# Patient Record
Sex: Female | Born: 1970
Health system: Southern US, Community
[De-identification: ages and names within clinical notes are randomized; demographics above are authoritative.]

## PROBLEM LIST (undated history)

## (undated) DIAGNOSIS — F329 Major depressive disorder, single episode, unspecified: Secondary | ICD-10-CM

## (undated) DIAGNOSIS — L409 Psoriasis, unspecified: Secondary | ICD-10-CM

## (undated) DIAGNOSIS — F419 Anxiety disorder, unspecified: Secondary | ICD-10-CM

## (undated) DIAGNOSIS — F32A Depression, unspecified: Secondary | ICD-10-CM

## (undated) HISTORY — DX: Anxiety disorder, unspecified: F41.9

---

## 2005-03-13 ENCOUNTER — Encounter: Admission: RE | Admit: 2005-03-13 | Discharge: 2005-03-13 | Payer: Self-pay | Admitting: *Deleted

## 2007-03-22 ENCOUNTER — Encounter: Admission: RE | Admit: 2007-03-22 | Discharge: 2007-03-22 | Payer: Self-pay | Admitting: *Deleted

## 2007-03-22 IMAGING — MG MM DIAGNOSTIC BILATERAL
5 series · 5 of 5 positions shown · non-contrast
Comparison: none

DG DIAGNOSTIC BILATERAL
Bilateral CC and MLO view(s) were taken.

RIGHT BREAST ULTRASOUND
Technologist: CARMEN SOCORRO, Medical
DIGITAL BILATERAL DIAGNOSTIC MAMMOGRAM WITH CAD AND RIGHT BREAST ULTRASOUND:
CLINICAL DATA: The patient has a tender lump in the right subareolar region at 10 o'clock.  She 
states that this has decreased in size since it was initially found.

[R CC]
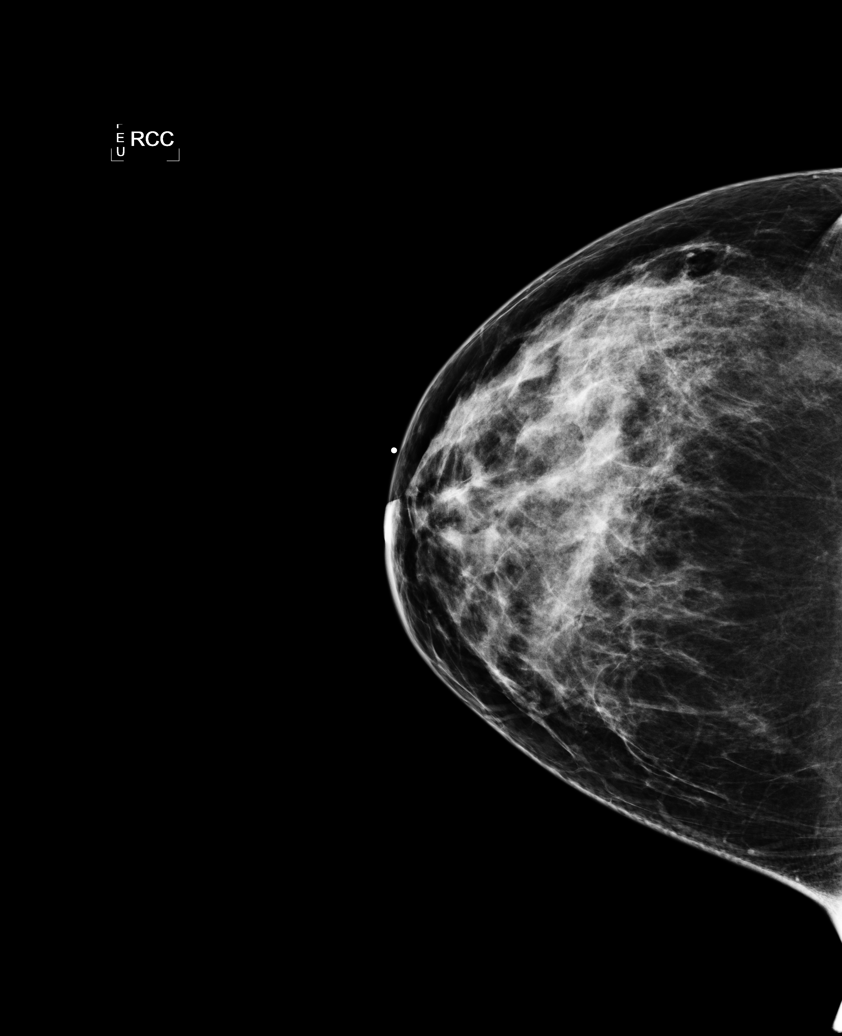

[L CC]
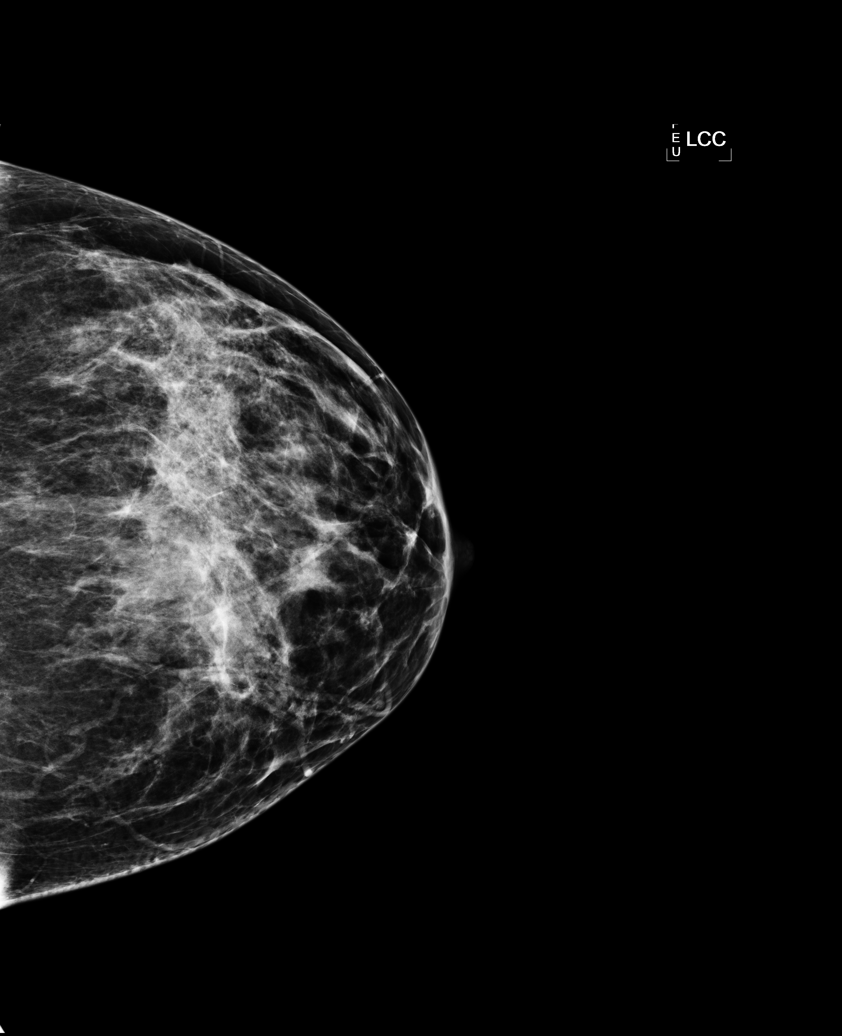

[L MLO]
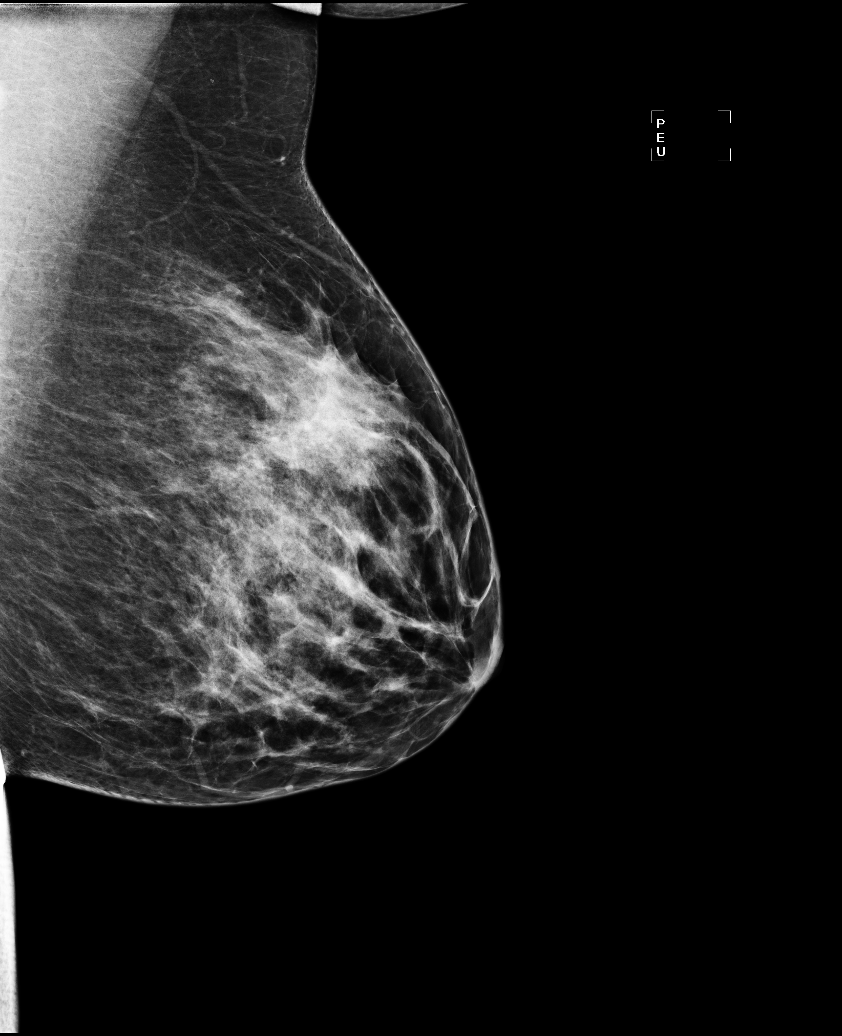

[R MLO]
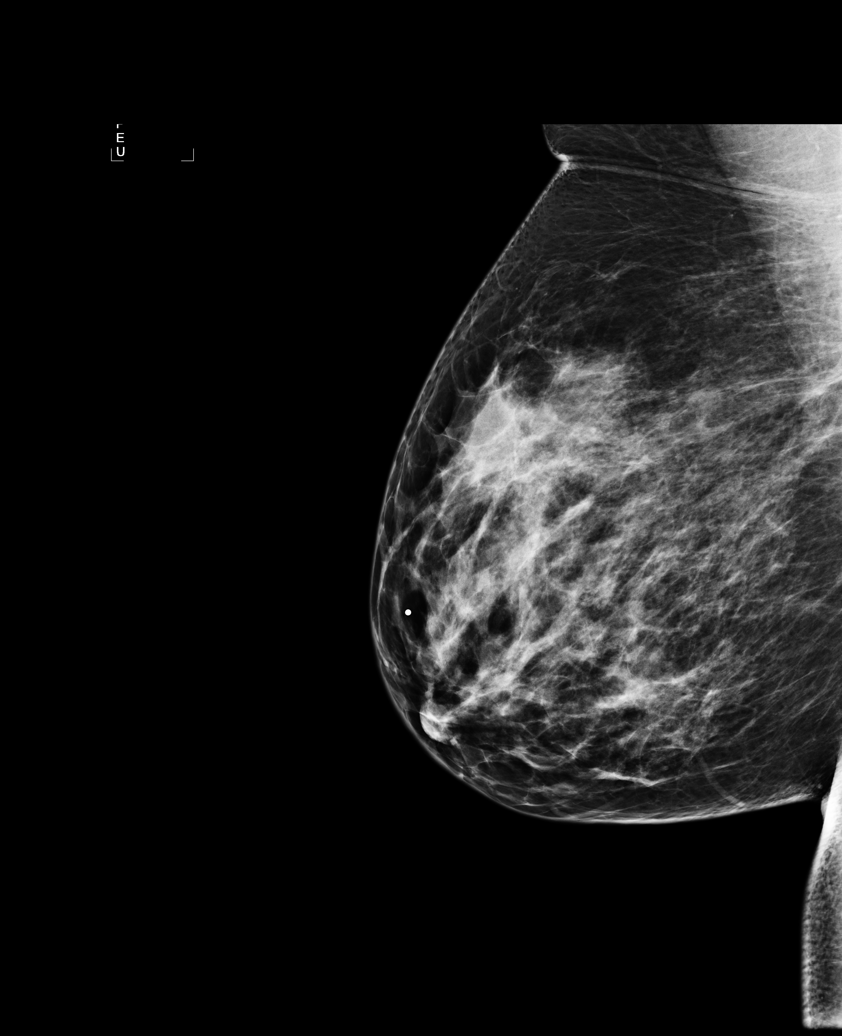

[R TAN]
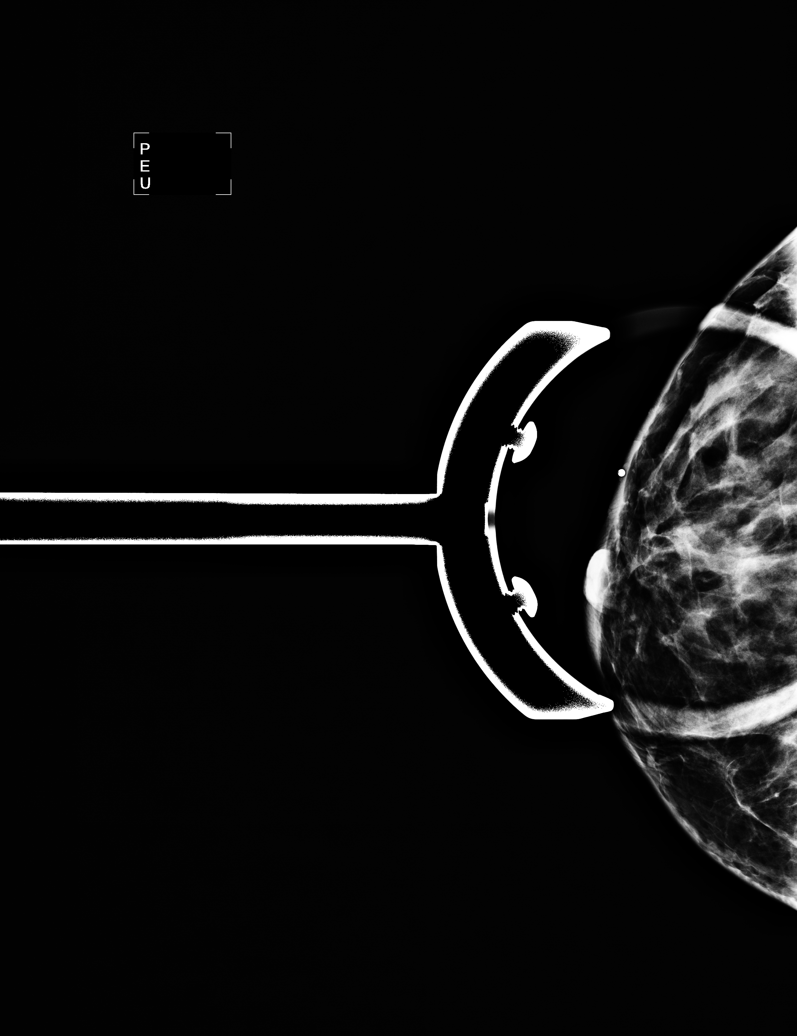

[5 of 5 positions shown; findings below may reference images not displayed]

Comparison is made to prior study dated [DATE].  The breast tissue is heterogeneously dense.  There
is no dominant mass, architectural distortion or calcification to suggest malignancy.

On physical examination, no mass is palpated in the right subareolar region.  Sonography of the 
area of concern to the patient at 10 o'clock in the right subareolar region demonstrates mixed 
fibroglandular tissue and fat with no mass, distortion or shadowing to suggest malignancy.  No 
fluid collection to suggest abscess is identified. There is no erythema or warmth on physical 
examination.
IMPRESSION: No mammographic or sonographic evidence of malignancy.  Yearly screening mammography should begin 
at age 40 unless clinically indicated earlier.

ASSESSMENT: Negative - BI-RADS 1

Routine screening mammogram at age 41.
ANALYZED BY COMPUTER AIDED DETECTION. ,

## 2007-03-22 IMAGING — US UNKNOWN PR STUDY
1 series · 2 of 2 positions shown · non-contrast
Comparison: none

DG DIAGNOSTIC BILATERAL
Bilateral CC and MLO view(s) were taken.

RIGHT BREAST ULTRASOUND
Technologist: CARMEN SOCORRO, Medical
DIGITAL BILATERAL DIAGNOSTIC MAMMOGRAM WITH CAD AND RIGHT BREAST ULTRASOUND:
CLINICAL DATA: The patient has a tender lump in the right subareolar region at 10 o'clock.  She 
states that this has decreased in size since it was initially found.

[Series 1: unknown pr study · 2 of 2 slices shown]
[im 1/2]
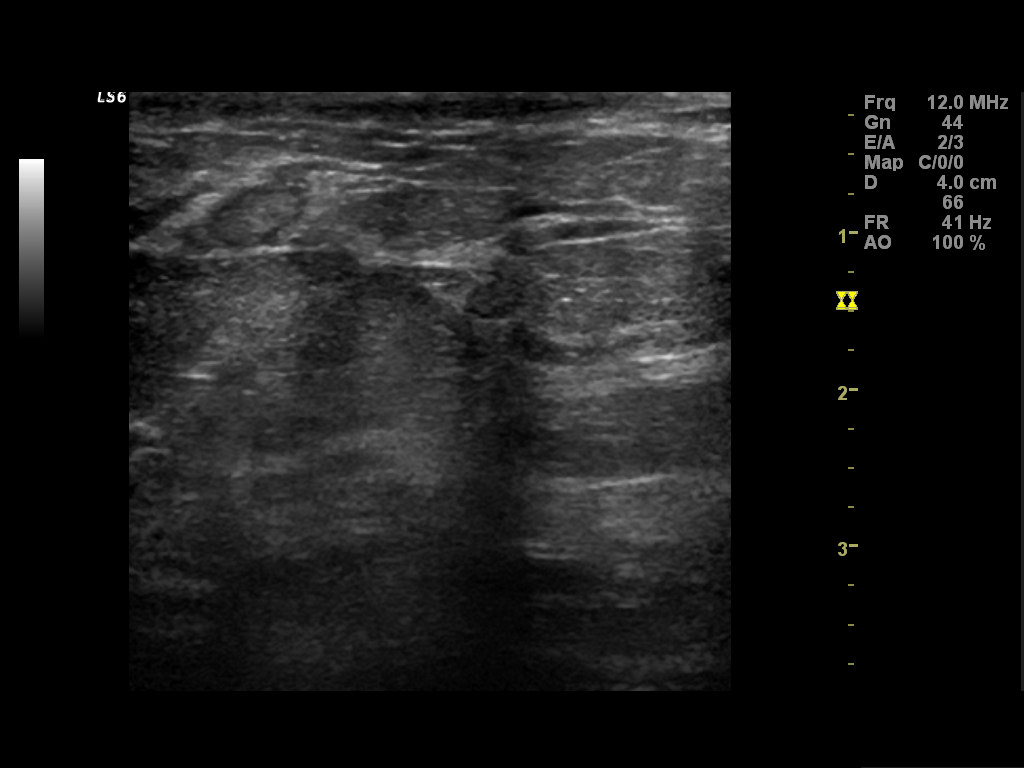
[im 2/2]
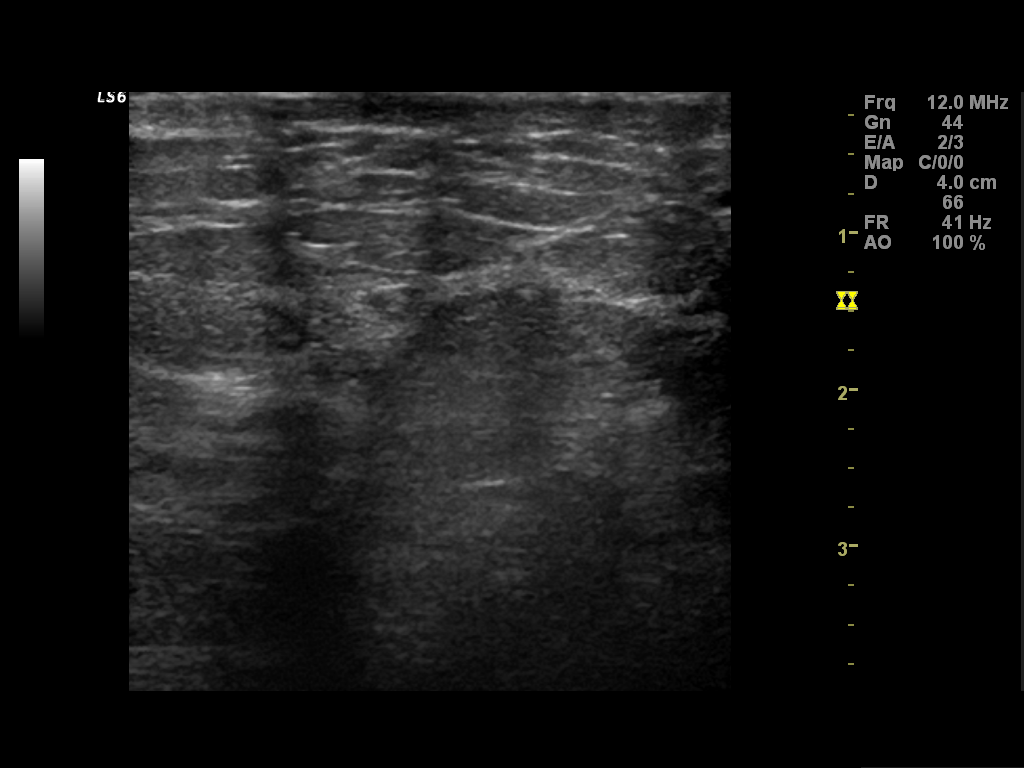

[2 of 2 positions shown; findings below may reference images not displayed]

Comparison is made to prior study dated [DATE].  The breast tissue is heterogeneously dense.  There
is no dominant mass, architectural distortion or calcification to suggest malignancy.

On physical examination, no mass is palpated in the right subareolar region.  Sonography of the 
area of concern to the patient at 10 o'clock in the right subareolar region demonstrates mixed 
fibroglandular tissue and fat with no mass, distortion or shadowing to suggest malignancy.  No 
fluid collection to suggest abscess is identified. There is no erythema or warmth on physical 
examination.
IMPRESSION: No mammographic or sonographic evidence of malignancy.  Yearly screening mammography should begin 
at age 41 unless clinically indicated earlier.

ASSESSMENT: Negative - BI-RADS 1

Routine screening mammogram at age 41.
ANALYZED BY COMPUTER AIDED DETECTION. ,

## 2010-03-10 ENCOUNTER — Emergency Department (HOSPITAL_COMMUNITY): Admission: EM | Admit: 2010-03-10 | Discharge: 2010-03-10 | Payer: Self-pay | Admitting: Emergency Medicine

## 2010-03-10 IMAGING — CT CT HEAD W/O CM
1 series · 16 of 30 positions shown, 20 images · non-contrast
Comparison: None.

CLINICAL DATA: Headache, dizziness

CT HEAD WITHOUT CONTRAST
TECHNIQUE: Contiguous axial images were obtained from the base of
the skull through the vertex without contrast.

[Series 2: head_seq 4.5 h37s st · axial · 0.41mm/px · z∈[+1264,+1408]mm · 16 of 36 slices shown, 20 images]
[im 2/36  brain]
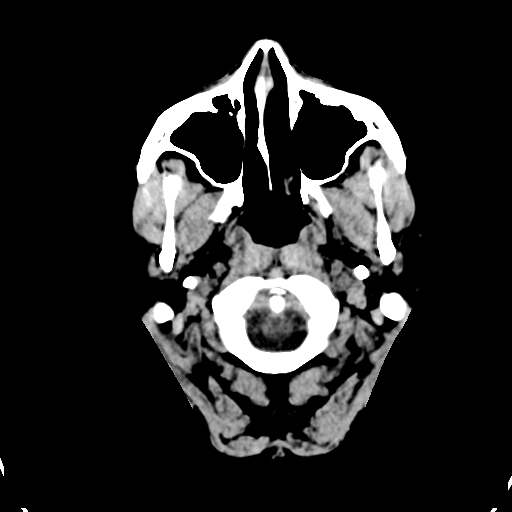
[im 2/36  bone]
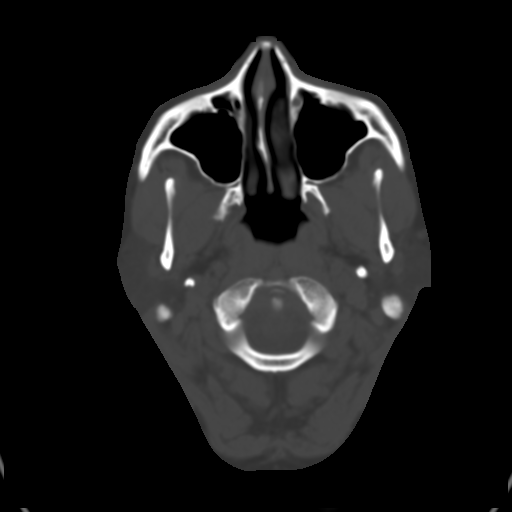
[im 4/36  brain]
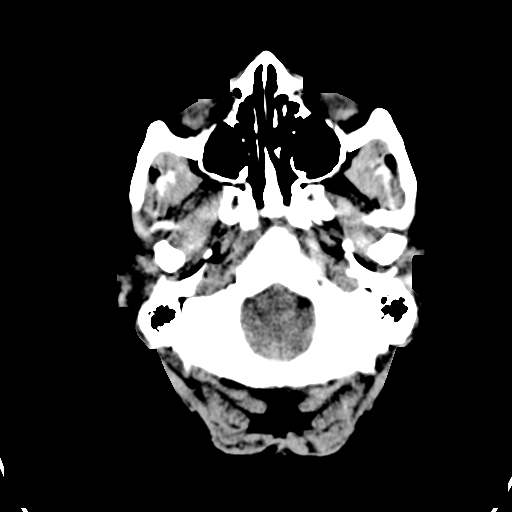
[im 7/36  brain]
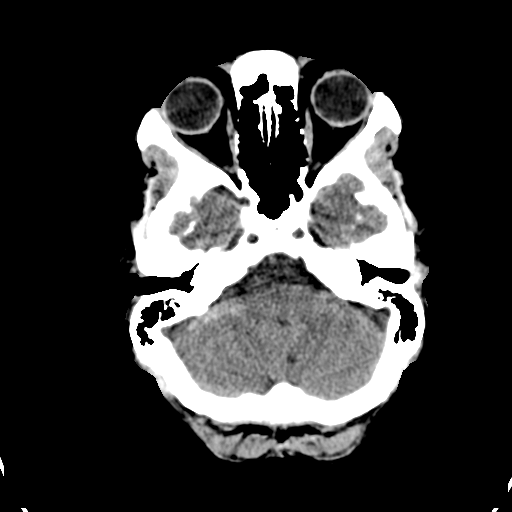
[im 9/36  brain]
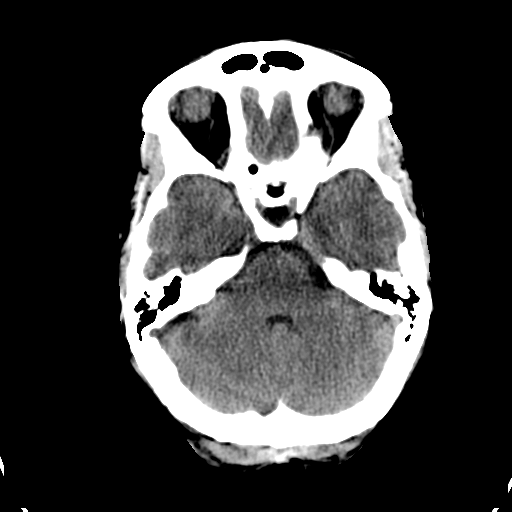
[im 10/36  brain]
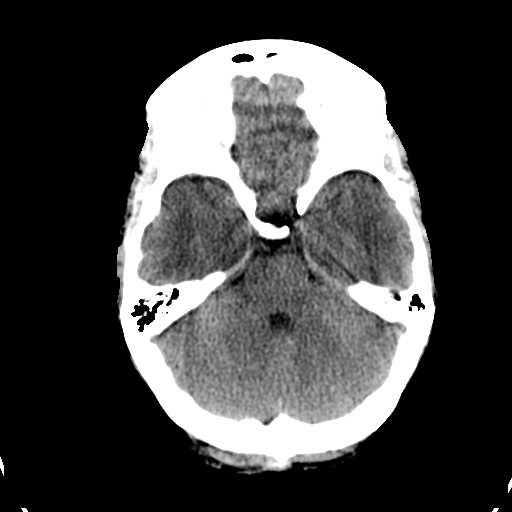
[im 10/36  bone]
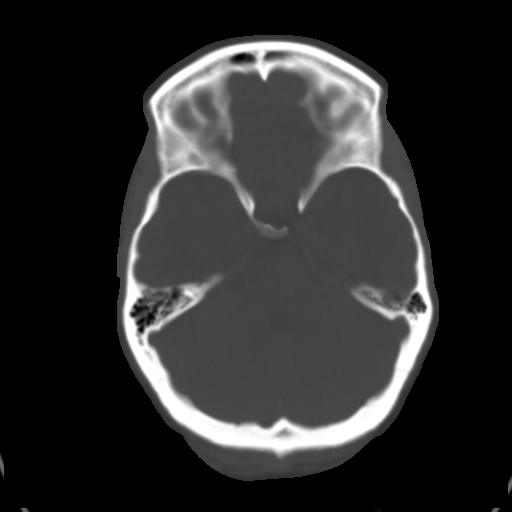
[im 13/36  brain]
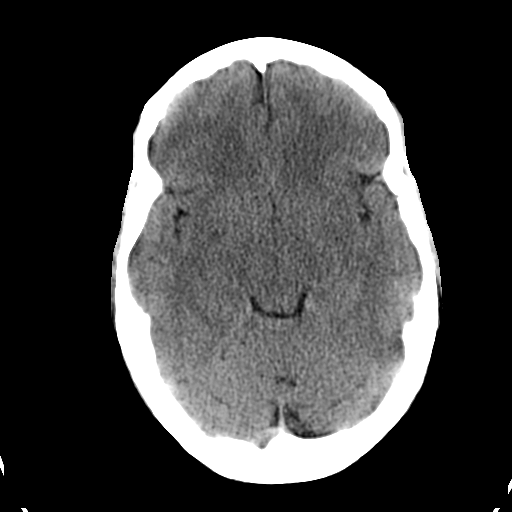
[im 15/36  brain]
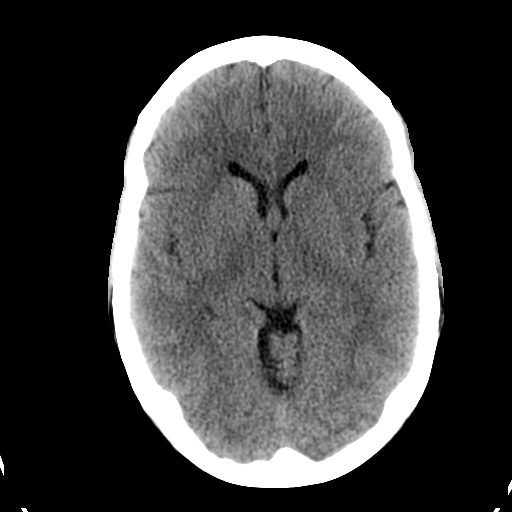
[im 17/36  brain]
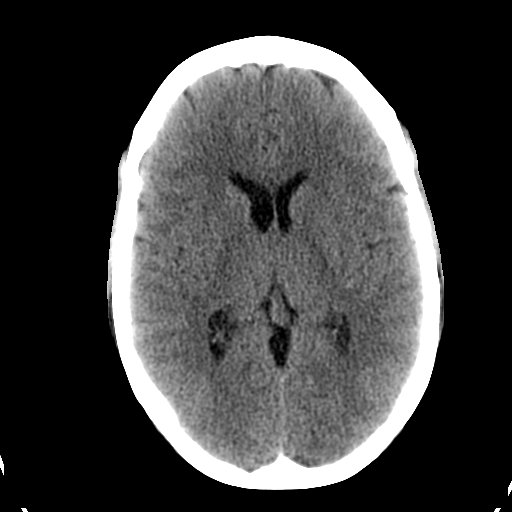
[im 19/36  brain]
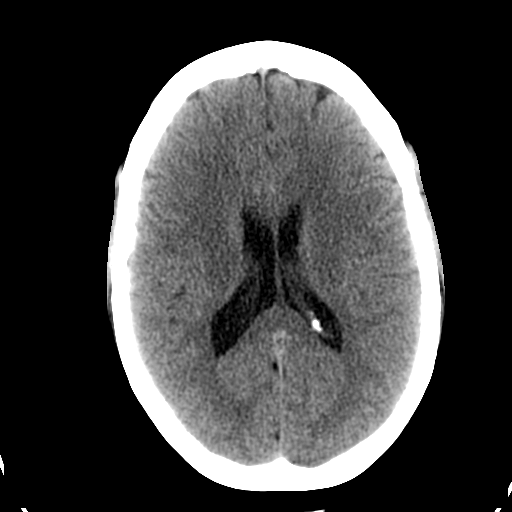
[im 19/36  bone]
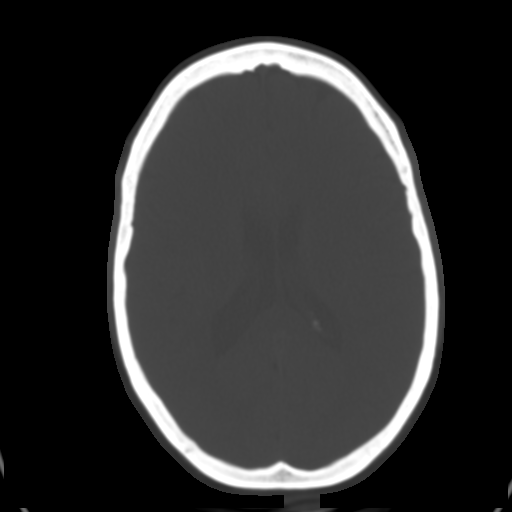
[im 21/36  brain]
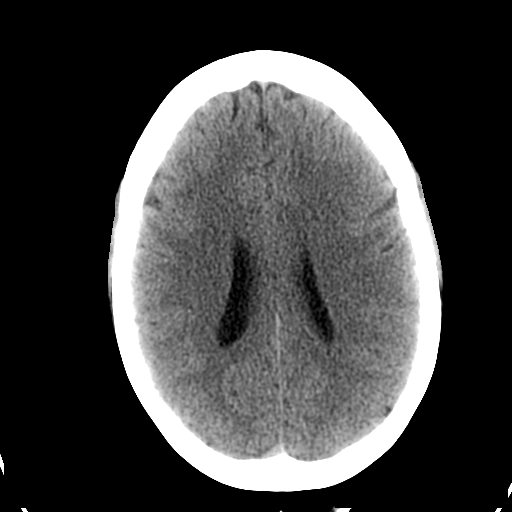
[im 23/36  brain]
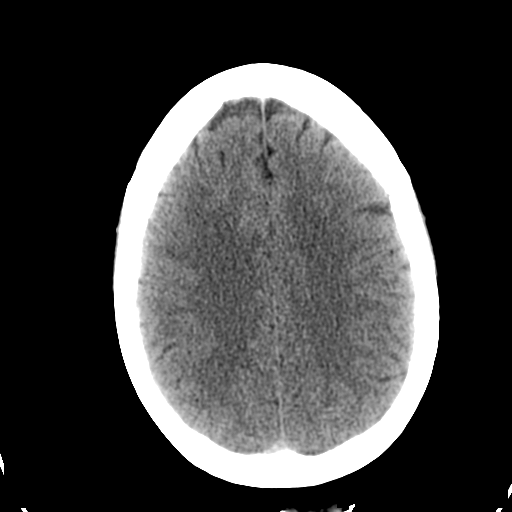
[im 26/36  brain]
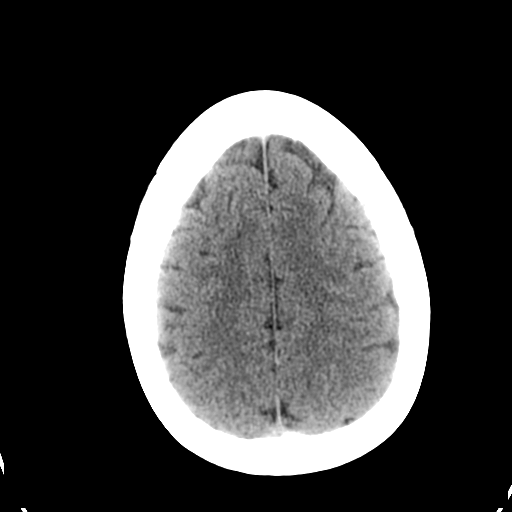
[im 27/36  brain]
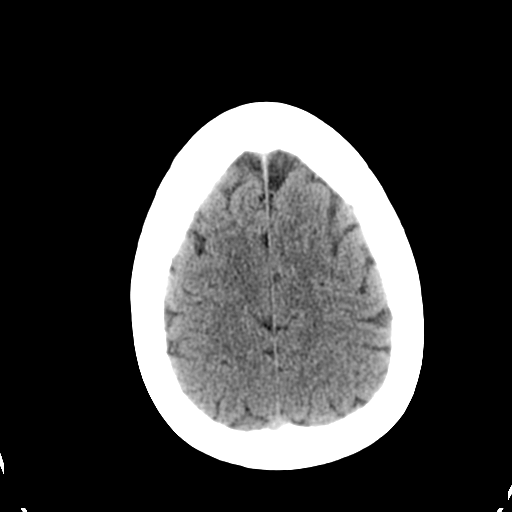
[im 27/36  bone]
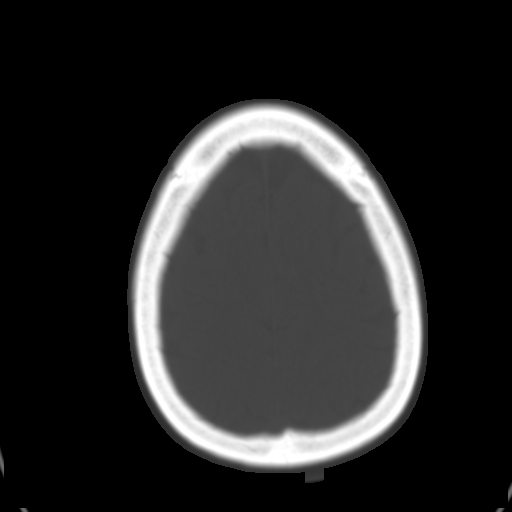
[im 29/36  brain]
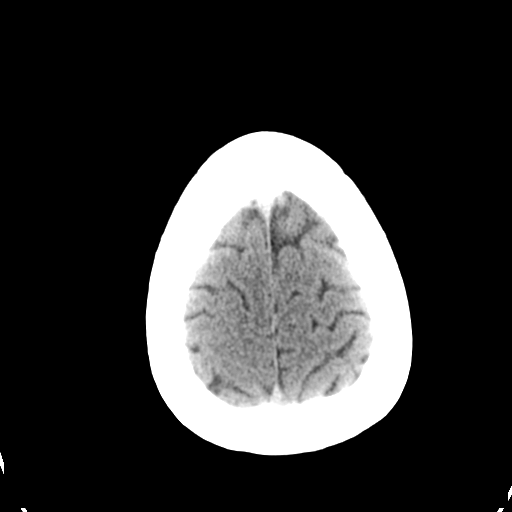
[im 32/36  brain]
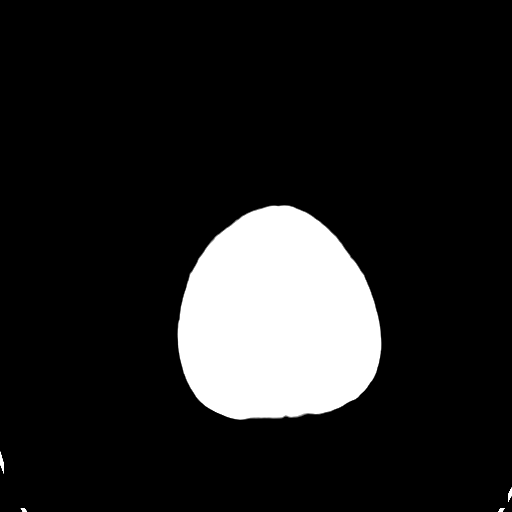
[im 34/36  brain]
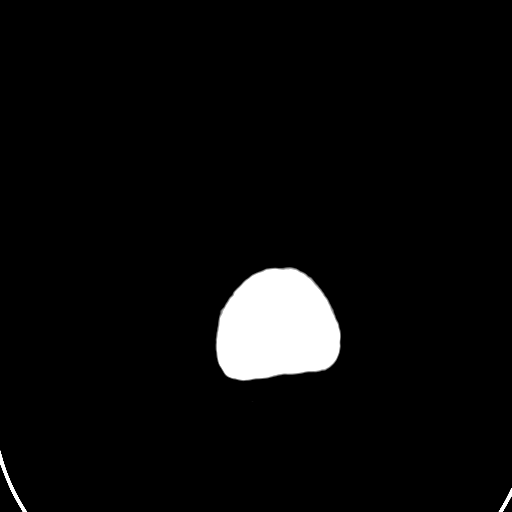

[16 of 30 positions shown; findings below may reference images not displayed]

FINDINGS: There is no evidence of acute intracranial hemorrhage,
brain edema, mass lesion, acute infarction,   mass effect, or
midline shift. Acute infarct may be inapparent on noncontrast CT.
No other intra-axial abnormalities are seen, and the ventricles and
sulci are within normal limits in size and symmetry.   No abnormal
extra-axial fluid collections or masses are identified.  No
significant calvarial abnormality.
IMPRESSION: Negative for bleed or other acute intracranial process.

## 2010-09-26 LAB — CSF CELL COUNT WITH DIFFERENTIAL
RBC Count, CSF: 1850 /mm3 — ABNORMAL HIGH
Tube #: 1
WBC, CSF: 1 /mm3 (ref 0–5)

## 2010-09-26 LAB — CSF CULTURE W GRAM STAIN

## 2011-08-28 ENCOUNTER — Emergency Department (HOSPITAL_COMMUNITY)
Admission: EM | Admit: 2011-08-28 | Discharge: 2011-08-29 | Disposition: A | Discharge status: Home / Self Care | Payer: Self-pay | Attending: Emergency Medicine | Admitting: Emergency Medicine

## 2011-08-28 ENCOUNTER — Encounter (HOSPITAL_COMMUNITY): Payer: Self-pay | Admitting: *Deleted

## 2011-08-28 DIAGNOSIS — W64XXXA Exposure to other animate mechanical forces, initial encounter: Secondary | ICD-10-CM | POA: Insufficient documentation

## 2011-08-28 DIAGNOSIS — IMO0002 Reserved for concepts with insufficient information to code with codable children: Secondary | ICD-10-CM | POA: Insufficient documentation

## 2011-08-28 DIAGNOSIS — Y9239 Other specified sports and athletic area as the place of occurrence of the external cause: Secondary | ICD-10-CM | POA: Insufficient documentation

## 2011-08-28 DIAGNOSIS — Y92838 Other recreation area as the place of occurrence of the external cause: Secondary | ICD-10-CM | POA: Insufficient documentation

## 2011-08-28 DIAGNOSIS — Z203 Contact with and (suspected) exposure to rabies: Secondary | ICD-10-CM | POA: Insufficient documentation

## 2011-08-28 DIAGNOSIS — Z23 Encounter for immunization: Secondary | ICD-10-CM | POA: Insufficient documentation

## 2011-08-28 MED ORDER — RABIES VACCINE, PCEC IM SUSR
1.0000 mL | Freq: Once | INTRAMUSCULAR | Status: DC
  Filled 2011-08-28: qty 1

## 2011-08-28 MED ORDER — RABIES IMMUNE GLOBULIN 150 UNIT/ML IM INJ
20.0000 [IU]/kg | INJECTION | Freq: Once | INTRAMUSCULAR | Status: DC
  Filled 2011-08-28: qty 10
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
## 2011-08-29 NOTE — ED Provider Notes (Signed)
Medical screening examination/treatment/procedure(s) were performed by non-physician practitioner and as supervising physician I was immediately available for consultation/collaboration.  Doug Sou, MD 08/29/11 [phone]

## 2011-08-29 NOTE — Discharge Instructions (Signed)
Rabies  You may have been exposed to rabies. It can be carried by skunks, bats, woodchucks, raccoons and foxes. It is less common in dogs; however this is one of the most common bites for which the rabies vaccine is given. When bitten by an infected animal, a person gets rabies from the infected saliva (spit) of the animal. Most people get sick 20 to 90 days after being bitten. This varies based on the location of the bite. The rabies virus affects the brain so the closer to the head the bite occurs, the less time it will take the illness to develop. Once rabies develops it almost always causes death. Because of this, it is often necessary to start treatment whether it is known if the animal is healthy or not. If bitten by an animal and the animal can be observed to see if it remains healthy, often no further treatment is necessary other than care of the wounds caused by the animal. If the animal has been killed it can be sent into a state laboratory and the brain can be examined to see if the animal had rabies. TREATMENT  There is no known treatment for rabies once the disease starts. This is a viral illness and antibiotics (medications which kill germs) do not help. This is why caregivers use extra caution and treat questionable bites with rabies vaccine to prevent the disease. RABIES IMMUNIZATION SCHEDULE - POST-EXPOSURE Be sure to record the dates of your injections for your records. 1st Injection Date________________________ Day 3__________________________________ Day 7__________________________________ Day 14_________________________________ HOME CARE INSTRUCTIONS  If you have received a bite from an unknown animal, make sure you know the instructions for follow up. If the animal was sent to a laboratory for examination, make sure you know how you are to obtain your results.  Keep wound clean, dry and dressed as suggested by your caregiver.   Take antibiotics as directed and finish the  prescription, even if the wound appears OK.   Keep injured part elevated as much as possible.   Do not resume use of the affected area until directed.   Only take over-the-counter or prescription medicines for pain, discomfort, or fever as directed by your caregiver.  SEEK IMMEDIATE MEDICAL CARE IF:   You have a fever.   There is nausea (feeling sick to your stomach), vomiting, chills or a high temperature.   There is unusual behavior including hyperactivity, fear of water (hydrophobia), or fear of air (aerophobia).   If pain prevents movement of any joint.   If you are unable to move a finger or toe.   The wound becomes more inflamed and swollen, or has a purulent (pus-like) discharge.   You notice that there is a foreign substance, such as cloth or a tooth, in the wound.   If a red line develops at the site of the wound and begins to move up the arm or leg.  Document Released: 06/29/2005 Document Revised: 03/11/2011 Document Reviewed: 10/04/2008 [facility] Patient Information 2012 [place], [place].

## 2011-09-01 ENCOUNTER — Emergency Department (INDEPENDENT_AMBULATORY_CARE_PROVIDER_SITE_OTHER)
Admission: EM | Admit: 2011-09-01 | Discharge: 2011-09-01 | Disposition: A | Discharge status: Home / Self Care | Payer: Self-pay | Source: Home / Self Care | Attending: Family Medicine | Admitting: Family Medicine

## 2011-09-01 ENCOUNTER — Encounter (HOSPITAL_COMMUNITY): Payer: Self-pay | Admitting: *Deleted

## 2011-09-01 DIAGNOSIS — Z23 Encounter for immunization: Secondary | ICD-10-CM

## 2011-09-01 MED ORDER — RABIES VACCINE, PCEC IM SUSR
INTRAMUSCULAR | Status: AC
  Filled 2011-09-01: qty 1

## 2011-09-01 MED ORDER — RABIES VACCINE, PCEC IM SUSR
1.0000 mL | Freq: Once | INTRAMUSCULAR | Status: AC
  Administered 2011-09-01: 1 mL via INTRAMUSCULAR

## 2011-09-01 NOTE — ED Notes (Signed)
Here for second Rabies vaccine for fox scratch to R shin with tooth. Wound healing. No signs of infection.

## 2011-09-01 NOTE — Discharge Instructions (Signed)
Call if any problems.  Return 2/23 for next vaccine.

## 2011-09-05 ENCOUNTER — Emergency Department (HOSPITAL_COMMUNITY)
Admission: EM | Admit: 2011-09-05 | Discharge: 2011-09-05 | Disposition: A | Discharge status: Home / Self Care | Payer: Self-pay | Source: Home / Self Care | Attending: Emergency Medicine | Admitting: Emergency Medicine

## 2011-09-05 ENCOUNTER — Encounter (HOSPITAL_COMMUNITY): Payer: Self-pay | Admitting: *Deleted

## 2011-09-05 MED ORDER — RABIES VACCINE, PCEC IM SUSR
INTRAMUSCULAR | Status: AC
  Filled 2011-09-05: qty 1

## 2011-09-05 MED ORDER — RABIES VACCINE, PCEC IM SUSR
1.0000 mL | Freq: Once | INTRAMUSCULAR | Status: AC
  Administered 2011-09-05: 1 mL via INTRAMUSCULAR

## 2011-09-05 NOTE — ED Notes (Signed)
Patient here for 2nd rabies follow up injection

## 2011-09-05 NOTE — ED Notes (Signed)
Pt here for rabies vaccine - no c/o - scratch site with no signs of infection

## 2011-09-05 NOTE — Discharge Instructions (Signed)
Please return next Saturday for final injection.

## 2011-09-12 ENCOUNTER — Emergency Department (HOSPITAL_COMMUNITY)
Admission: EM | Admit: 2011-09-12 | Discharge: 2011-09-12 | Disposition: A | Discharge status: Home / Self Care | Payer: Self-pay | Source: Home / Self Care | Attending: Family Medicine | Admitting: Family Medicine

## 2011-09-12 ENCOUNTER — Encounter (HOSPITAL_COMMUNITY): Payer: Self-pay | Admitting: Cardiology

## 2011-09-12 MED ORDER — RABIES VACCINE, PCEC IM SUSR
INTRAMUSCULAR | Status: AC
  Filled 2011-09-12: qty 1

## 2011-09-12 MED ORDER — IBUPROFEN 800 MG PO TABS
ORAL_TABLET | ORAL | Status: AC
  Filled 2011-09-12: qty 1

## 2011-09-12 MED ORDER — RABIES VACCINE, PCEC IM SUSR
1.0000 mL | Freq: Once | INTRAMUSCULAR | Status: AC
  Administered 2011-09-12: 1 mL via INTRAMUSCULAR

## 2011-09-12 NOTE — ED Notes (Signed)
Pt here for final rabies vaccine. No sign of infection at scratch site. Pt denies fever.

## 2011-09-12 NOTE — Discharge Instructions (Signed)
No more injections!!!  Rabies series is complete

## 2011-09-30 ENCOUNTER — Telehealth (HOSPITAL_COMMUNITY): Payer: Self-pay | Admitting: *Deleted

## 2011-09-30 NOTE — ED Notes (Signed)
3/13 Pt. called and said she is missing the paperwork for rabies shot on 2/23 and needs a copy for her [facility] insurance.  I asked Mable Fill and she said the charge has not been released yet but she will work on it now so it will be sent tomorrow. 3/14 1559 I called and left message that the charge would be sent to her in a few days.  I asked her to call me when she received the message.

## 2011-09-30 NOTE — ED Notes (Signed)
I called pt. back and she said she had phone problems and did not receive my message. I told her we had completed the charging and the bill should be coming ? this week. Pt. said she has not received it yet but was able to use her d/c instructions for proof of visit that day. She said she is trying to get her records. I told her if she comes here with a picture ID and fills out a medical release form we can give her the records from the 3 visits at [facility]. Vassie Moselle 09/30/2011

## 2012-05-13 ENCOUNTER — Ambulatory Visit
Admission: RE | Admit: 2012-05-13 | Discharge: 2012-05-13 | Disposition: A | Discharge status: Home / Self Care | Payer: No Typology Code available for payment source | Source: Ambulatory Visit | Attending: Family Medicine | Admitting: Family Medicine

## 2012-05-13 ENCOUNTER — Other Ambulatory Visit: Payer: Self-pay | Admitting: Family Medicine

## 2012-05-13 DIAGNOSIS — Z006 Encounter for examination for normal comparison and control in clinical research program: Secondary | ICD-10-CM

## 2012-05-13 DIAGNOSIS — L409 Psoriasis, unspecified: Secondary | ICD-10-CM

## 2012-05-13 IMAGING — CR DG CHEST 1V
1 series · 1 of 1 positions shown · non-contrast
Comparison: None.

CLINICAL DATA: History of psoriasis, [REDACTED] study, smoking
history

CHEST - 1 VIEW

[w chest pa]
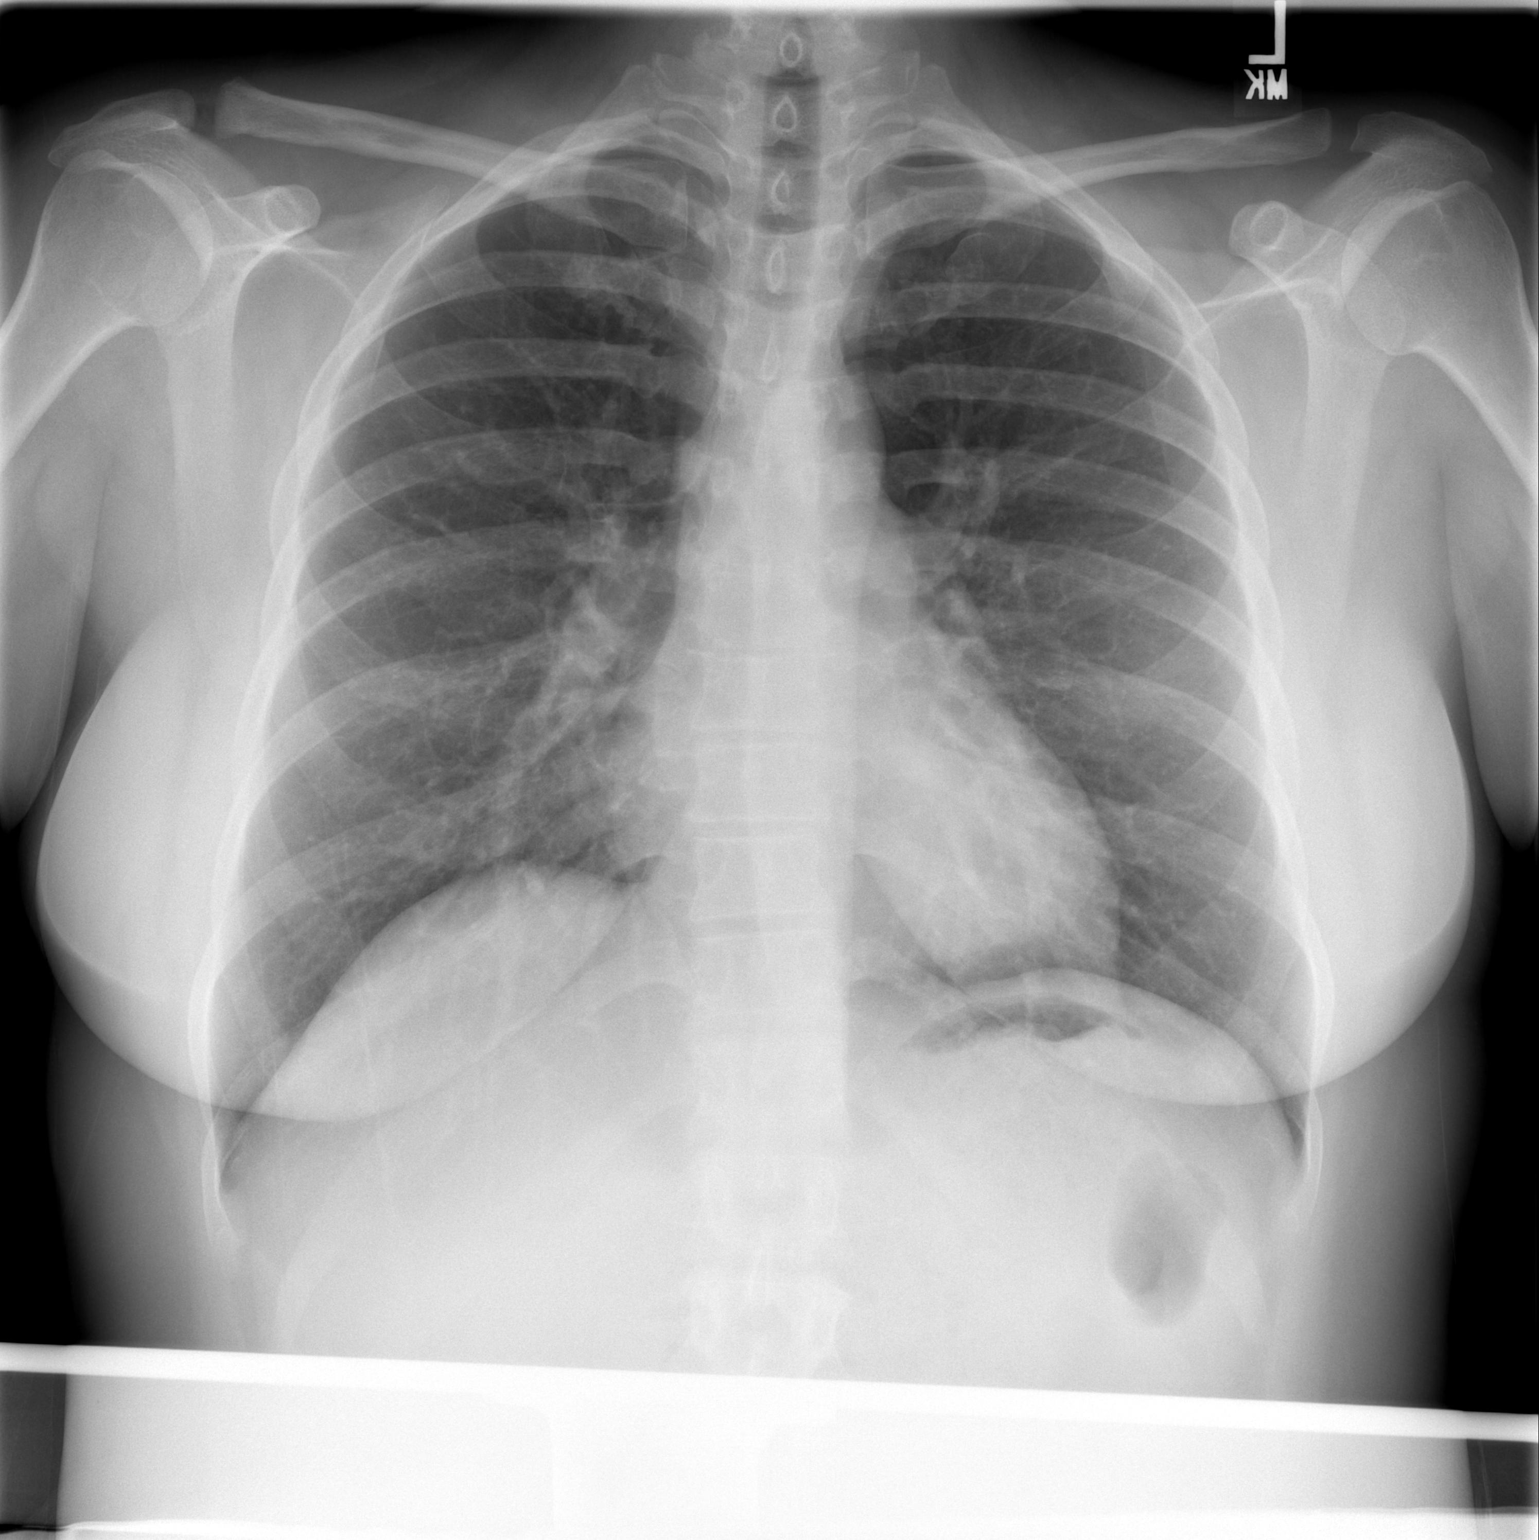

[1 of 1 positions shown; findings below may reference images not displayed]

FINDINGS: No active infiltrate or effusion is seen.  Mediastinal
contours appear normal.  The heart is within normal limits in size.
No bony abnormality is seen.
IMPRESSION: No active lung disease.

## 2012-08-01 ENCOUNTER — Ambulatory Visit: Payer: Managed Care, Other (non HMO)

## 2012-08-01 ENCOUNTER — Ambulatory Visit (INDEPENDENT_AMBULATORY_CARE_PROVIDER_SITE_OTHER): Payer: Managed Care, Other (non HMO) | Admitting: Internal Medicine

## 2012-08-01 VITALS — BP 142/82 | HR 80 | Temp 98.4°F | Resp 12 | Ht 62.0 in | Wt 193.0 lb

## 2012-08-01 DIAGNOSIS — M25579 Pain in unspecified ankle and joints of unspecified foot: Secondary | ICD-10-CM

## 2012-08-01 DIAGNOSIS — Z6835 Body mass index (BMI) 35.0-35.9, adult: Secondary | ICD-10-CM

## 2012-08-01 DIAGNOSIS — L409 Psoriasis, unspecified: Secondary | ICD-10-CM

## 2012-08-01 DIAGNOSIS — L408 Other psoriasis: Secondary | ICD-10-CM

## 2012-08-01 IMAGING — CR DG ANKLE COMPLETE 3+V*L*
3 series · 3 of 3 positions shown · non-contrast
Comparison: None.

CLINICAL DATA: Pain after fall.

LEFT ANKLE COMPLETE - 3+ VIEW

[AP]
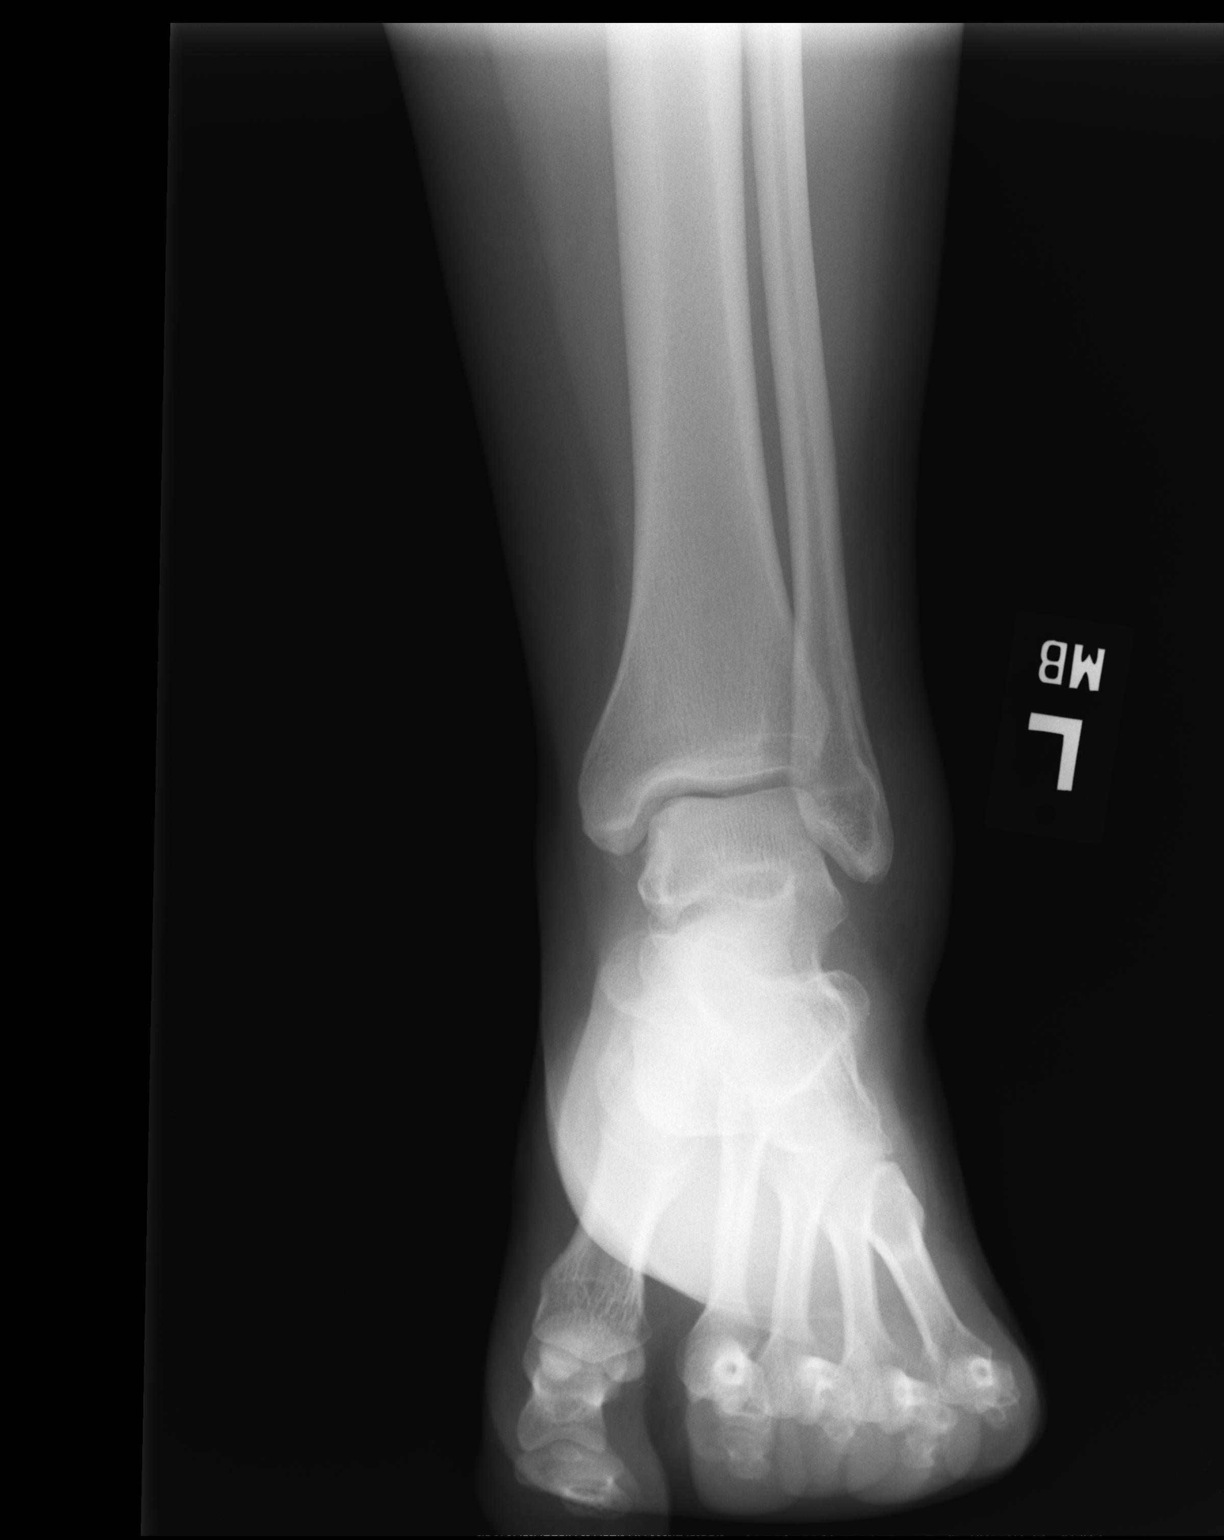

[ap obl int rot]
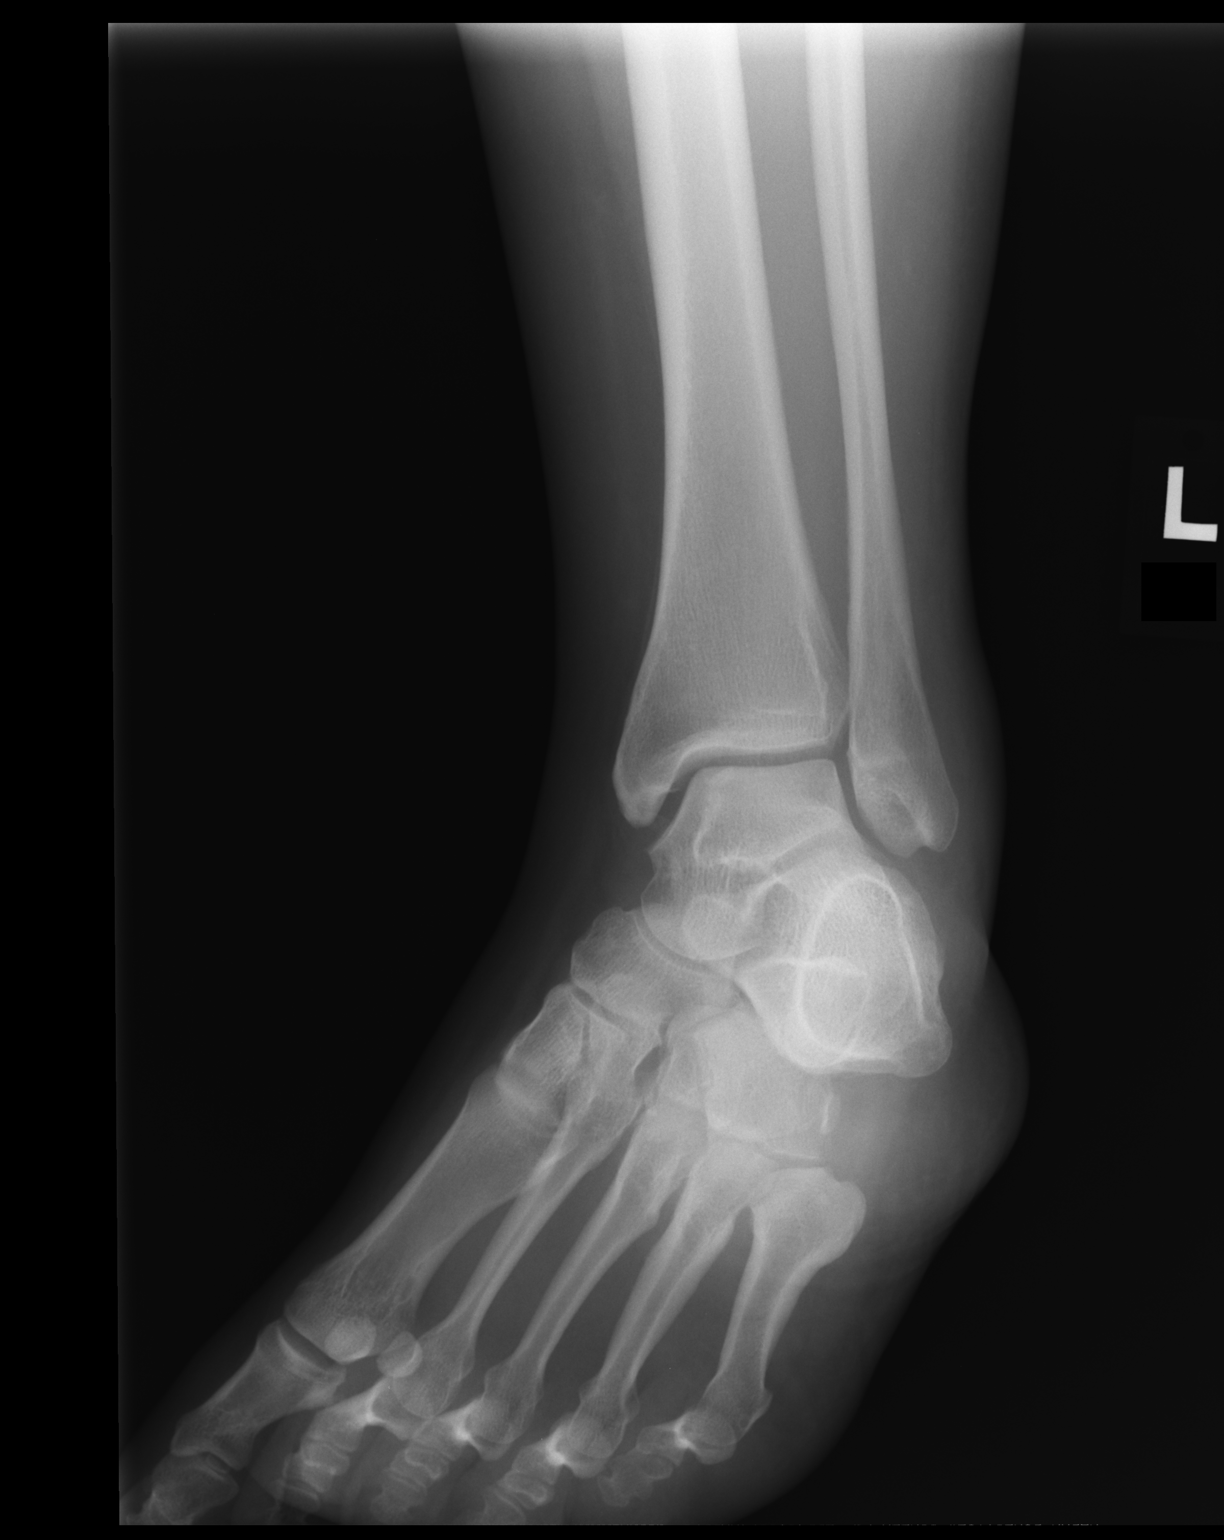

[lateral]
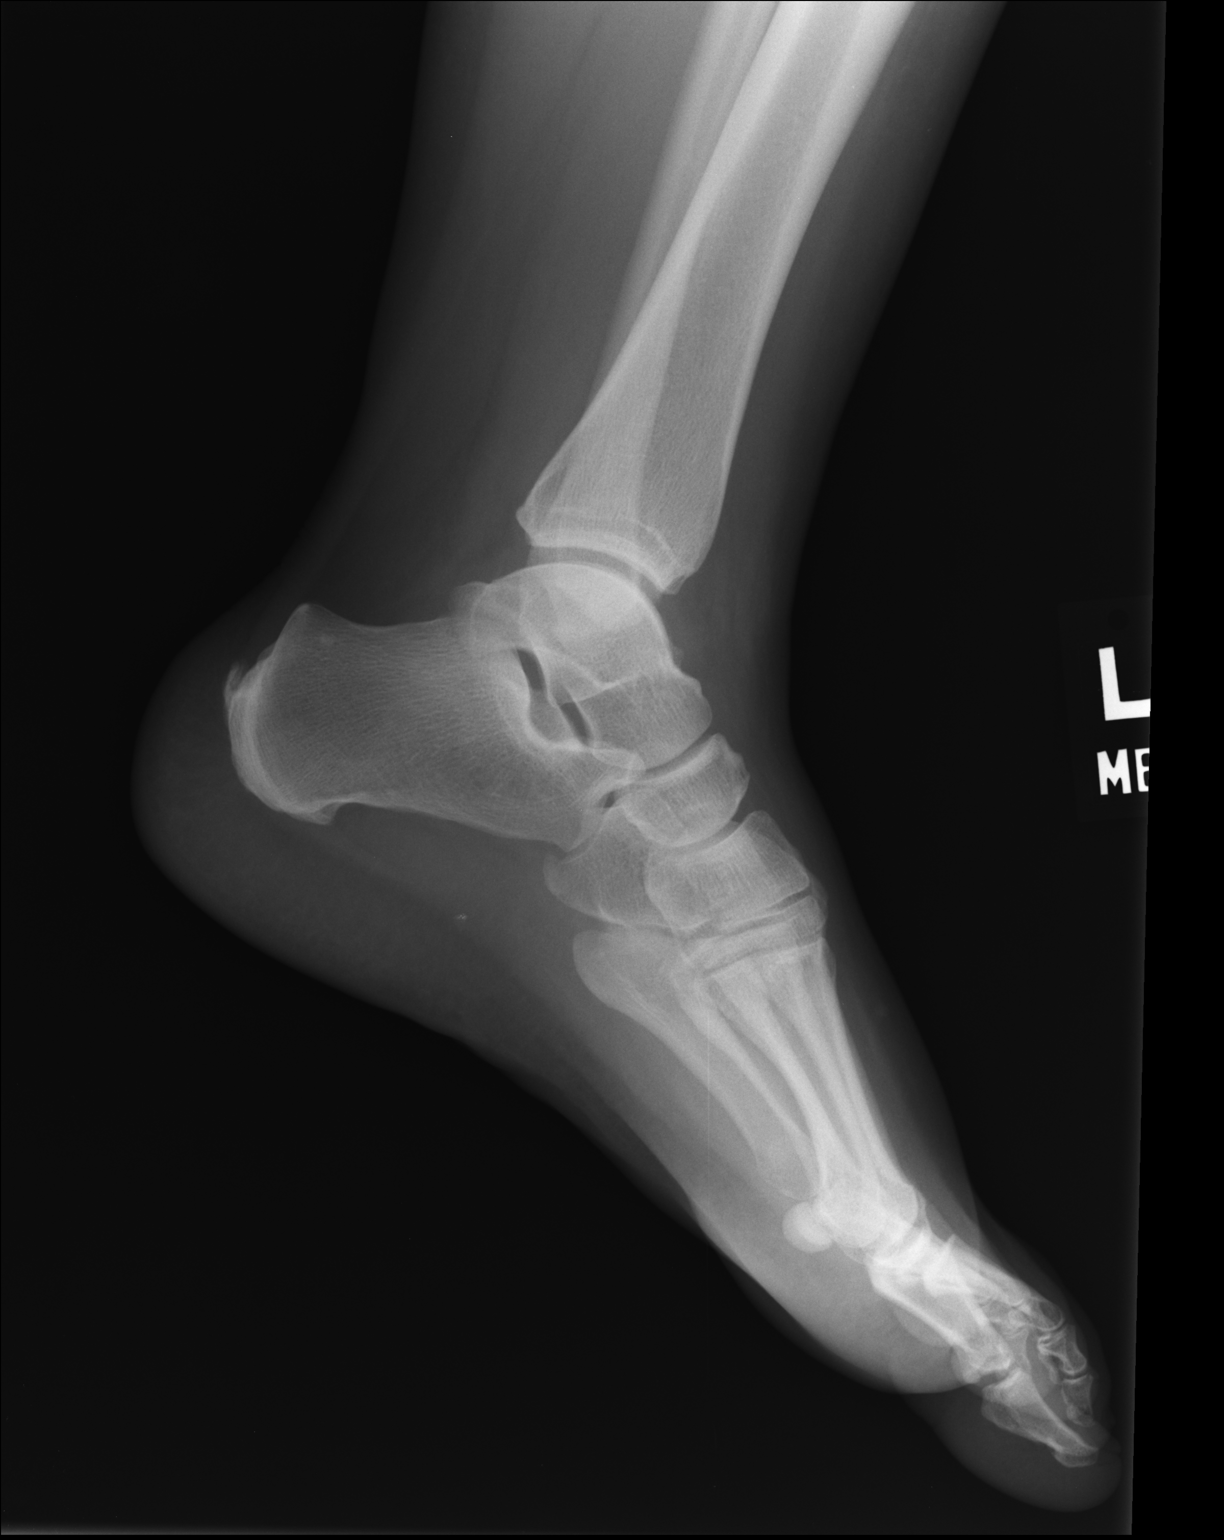

[3 of 3 positions shown; findings below may reference images not displayed]

FINDINGS: There is minimal soft tissue swelling over the lateral
ankle.  There is no acute fracture or dislocation.
IMPRESSION: No acute fracture.

## 2012-08-01 MED ORDER — HYDROCODONE-ACETAMINOPHEN 5-325 MG PO TABS: 1.0000 | ORAL_TABLET | Freq: Four times a day (QID) | ORAL | Status: DC | PRN

## 2012-08-01 NOTE — Progress Notes (Addendum)
  Subjective:    Patient ID: Linda Hodge, female    DOB: 07-Sep-1970, 42 y.o.   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 Kimorah Ridolfi=no fracture       Assessment & Plan:   1. Pain, ankle  DG Ankle Complete Left  2. Psoriasis    3. BMI 35.0-35.9,adult     Meds ordered this encounter  Medications  . HYDROcodone-acetaminophen (NORCO/VICODIN) 5-325 MG per tablet    Sig: Take 1 tablet by mouth every 6 (six) hours as needed for pain.    Dispense:  20 tablet    Refill:  0

## 2012-08-03 ENCOUNTER — Telehealth: Payer: Self-pay | Admitting: Radiology

## 2012-08-03 NOTE — Telephone Encounter (Signed)
Thanks! done

## 2012-08-03 NOTE — Telephone Encounter (Signed)
Dr. Merla Riches,   When you get the chance, xray needs the primary read so we can end the exam. Please let us know when it is complete.    Thank you!

## 2012-08-10 ENCOUNTER — Telehealth: Payer: Self-pay

## 2012-08-10 MED ORDER — HYDROCODONE-ACETAMINOPHEN 5-325 MG PO TABS: 1.0000 | ORAL_TABLET | Freq: Four times a day (QID) | ORAL | Status: DC | PRN

## 2012-08-10 NOTE — Telephone Encounter (Signed)
Please advise on renewal of hydrocodone

## 2012-08-10 NOTE — Telephone Encounter (Signed)
Ok Meds ordered this encounter  Medications  . HYDROcodone-acetaminophen (NORCO/VICODIN) 5-325 MG per tablet    Sig: Take 1 tablet by mouth every 6 (six) hours as needed for pain.    Dispense:  20 tablet    Refill:  0

## 2012-08-10 NOTE — Telephone Encounter (Signed)
Pt would like a refill on her pain medication hydrocone. She feels that because she  has been at work her sprained ankle has not been able to heal properly. Pt has been wearing boot that we provided for her. Best# [phone]

## 2012-08-11 NOTE — Telephone Encounter (Signed)
LMOM on pts VM to let her know.

## 2012-08-11 NOTE — Telephone Encounter (Signed)
Sent RX to pharmacy [facility].

## 2012-08-27 ENCOUNTER — Other Ambulatory Visit: Payer: Self-pay

## 2013-02-17 ENCOUNTER — Ambulatory Visit (INDEPENDENT_AMBULATORY_CARE_PROVIDER_SITE_OTHER): Payer: Managed Care, Other (non HMO) | Admitting: Emergency Medicine

## 2013-02-17 VITALS — BP 128/76 | HR 74 | Temp 98.1°F | Resp 18 | Ht 63.5 in | Wt 191.0 lb

## 2013-02-17 DIAGNOSIS — M79609 Pain in unspecified limb: Secondary | ICD-10-CM

## 2013-02-17 DIAGNOSIS — S91105A Unspecified open wound of left lesser toe(s) without damage to nail, initial encounter: Secondary | ICD-10-CM

## 2013-02-17 DIAGNOSIS — S91109A Unspecified open wound of unspecified toe(s) without damage to nail, initial encounter: Secondary | ICD-10-CM

## 2013-02-17 NOTE — Patient Instructions (Signed)

## 2013-02-17 NOTE — Progress Notes (Signed)
Verbal consent obtained from the patient.  Local anesthesia with 2cc Lidocaine 2% without epinephrine.  Wound scrubbed with soap and water and rinsed.  Wound closed with #1 4-0 Prolene horizontal mattress suture.  Wound cleansed and dressed.
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
## 2013-03-12 ENCOUNTER — Ambulatory Visit (INDEPENDENT_AMBULATORY_CARE_PROVIDER_SITE_OTHER): Payer: Managed Care, Other (non HMO) | Admitting: Physician Assistant

## 2013-03-12 VITALS — BP 126/82 | HR 87 | Temp 98.8°F | Resp 18 | Ht 63.0 in | Wt 188.0 lb

## 2013-03-12 DIAGNOSIS — J029 Acute pharyngitis, unspecified: Secondary | ICD-10-CM

## 2013-03-12 LAB — POCT RAPID STREP A (OFFICE): Rapid Strep A Screen: NEGATIVE

## 2013-03-12 MED ORDER — AZITHROMYCIN 250 MG PO TABS: ORAL_TABLET | ORAL | Status: DC

## 2013-03-12 NOTE — Progress Notes (Signed)
  Subjective:    Patient ID: Linda Hodge, female    DOB: 12/24/1970, 42 y.o.   MRN: [id number]  HPI 42 year old female presents with 2 day history of sore throat. States symptoms started suddenly and have persisted.  Admits to hx of a sore throat in the past that was never diagnosed as strep but she subsequently developed guttate psoriasis and was told that could have been from an untreated strep infection.  She is currently in a clinical trial for treatment of guttate psoriasis and is doing quite well - rash has completely resolved.  She is here today because she is concerned about her current sore throat and does not want to miss another strep infection.  Admits to chills and subjective fever. Also has PND and nasal congestion that she attributes to allergies.  Also has chronic intermittent cough due to GERD - states this is unchanged from baseline.  Patient is otherwise doing well with no other concerns today.     Review of Systems  Constitutional: Positive for fever (subjective) and chills.  HENT: Positive for congestion, sore throat, rhinorrhea, postnasal drip and sinus pressure. Negative for ear pain.   Respiratory: Positive for cough. Negative for shortness of breath and wheezing.   Gastrointestinal: Negative for nausea, vomiting and abdominal pain.  Neurological: Negative for dizziness and headaches.       Objective:   Physical Exam  Constitutional: She is oriented to person, place, and time. She appears well-developed and well-nourished.  HENT:  Head: Normocephalic and atraumatic.  Right Ear: Hearing, tympanic membrane, external ear and ear canal normal.  Left Ear: Hearing, tympanic membrane, external ear and ear canal normal.  Mouth/Throat: Uvula is midline and mucous membranes are normal. Posterior oropharyngeal erythema present. No oropharyngeal exudate, posterior oropharyngeal edema or tonsillar abscesses.  Eyes: Conjunctivae are normal.  Neck: Normal range of motion. Neck  supple.  Cardiovascular: Normal rate, regular rhythm and normal heart sounds.   Pulmonary/Chest: Effort normal and breath sounds normal.  Lymphadenopathy:    She has no cervical adenopathy.  Neurological: She is alert and oriented to person, place, and time.  Psychiatric: She has a normal mood and affect. Her behavior is normal. Judgment and thought content normal.      Results for orders placed in visit on 03/12/13  POCT RAPID STREP A (OFFICE)      Result Value Range   Rapid Strep A Screen Negative  Negative       Assessment & Plan:  Acute pharyngitis - Plan: POCT rapid strep A, Culture, Group A Strep, azithromycin (ZITHROMAX) 250 MG tablet  Throat culture sent Despite negative strep, will go ahead and cover with azithromycin due to PCN allergy Ibuprofen or tylenol as needed for pain Increase fluids and rest Follow up if symptoms worsen or fail to improve.

## 2013-03-14 LAB — CULTURE, GROUP A STREP: Organism ID, Bacteria: NORMAL

## 2013-05-18 ENCOUNTER — Other Ambulatory Visit: Payer: Self-pay

## 2014-07-25 ENCOUNTER — Ambulatory Visit (INDEPENDENT_AMBULATORY_CARE_PROVIDER_SITE_OTHER): Payer: Self-pay | Admitting: Physician Assistant

## 2014-07-25 VITALS — BP 143/80 | HR 89 | Temp 97.9°F | Resp 16 | Ht 63.5 in | Wt 226.0 lb

## 2014-07-25 DIAGNOSIS — M546 Pain in thoracic spine: Secondary | ICD-10-CM

## 2014-07-25 DIAGNOSIS — J209 Acute bronchitis, unspecified: Secondary | ICD-10-CM

## 2014-07-25 MED ORDER — ALBUTEROL SULFATE HFA 108 (90 BASE) MCG/ACT IN AERS: 2.0000 | INHALATION_SPRAY | RESPIRATORY_TRACT | Status: AC | PRN

## 2014-07-25 MED ORDER — HYDROCOD POLST-CHLORPHEN POLST 10-8 MG/5ML PO LQCR: 5.0000 mL | Freq: Two times a day (BID) | ORAL | Status: AC | PRN

## 2014-07-25 NOTE — Progress Notes (Signed)
Subjective:    Patient ID: Linda Hodge, female    DOB: 10/02/1970, 44 y.o.   MRN: [id number]  HPI Patient presents with 3 weeks of cough that is sometimes productive with green, thick sputum. Cough is throughout day, but is worse at night. Has difficulty catching breath following deep coughs. Cough intensity is causes pain under left shoulder blade that feels like a pulled muscle and feels sharp ache with cough. Shoulder blade pain present for 2 weeks. Cough associated with mild congestion and fatigue. Denies fever, sinus pressure, rhinorrhea, SOB, or CP. No h/o asthma or allergies. Former smoker. No sick contacts. Has tried Mucinex and Delsym without much relief. Med allergies: PCN and sulfa antibx.   Review of Systems  Constitutional: Positive for fatigue. Negative for fever, chills and activity change.  HENT: Positive for congestion. Negative for ear pain, postnasal drip, rhinorrhea, sinus pressure, sneezing and sore throat.   Respiratory: Positive for cough. Negative for chest tightness, shortness of breath and wheezing.   Cardiovascular: Negative for chest pain.  Gastrointestinal: Negative for nausea, vomiting and abdominal pain.  Musculoskeletal: Positive for back pain. Negative for neck pain.  Skin: Negative for rash.  Allergic/Immunologic: Negative for environmental allergies.       Objective:   Physical Exam  Constitutional: She is oriented to person, place, and time. She appears well-developed and well-nourished. No distress.  Blood pressure 143/80, pulse 89, temperature 97.9 F (36.6 C), temperature source Oral, resp. rate 16, height 5' 3.5" (1.613 m), weight 226 lb (102.513 kg), SpO2 98 %.  HENT:  Head: Normocephalic and atraumatic.  Right Ear: Tympanic membrane, external ear and ear canal normal.  Left Ear: Tympanic membrane, external ear and ear canal normal.  Nose: Rhinorrhea present. No mucosal edema. Right sinus exhibits no maxillary sinus tenderness and no frontal  sinus tenderness. Left sinus exhibits no maxillary sinus tenderness and no frontal sinus tenderness.  Mouth/Throat: Uvula is midline, oropharynx is clear and moist and mucous membranes are normal. No oropharyngeal exudate.  Eyes: Conjunctivae are normal. Pupils are equal, round, and reactive to light. Right eye exhibits no discharge. Left eye exhibits no discharge. No scleral icterus.  Neck: Neck supple. No thyromegaly present.  Cardiovascular: Normal rate, regular rhythm and normal heart sounds.  Exam reveals no gallop and no friction rub.   No murmur heard. Pulmonary/Chest: Effort normal. No accessory muscle usage. No respiratory distress. She has no decreased breath sounds. She has wheezes (fine expiratory; clears with coughing) in the right upper field and the right middle field. She has no rhonchi. She has no rales. She exhibits no tenderness.  Musculoskeletal:       Right shoulder: Normal.       Left shoulder: Normal.       Cervical back: Normal.       Thoracic back: She exhibits tenderness (with deep palpation over R shoulder blade) and pain. She exhibits normal range of motion, no bony tenderness, no swelling, no edema, no deformity and no spasm.  Lymphadenopathy:    She has no cervical adenopathy.  Neurological: She is alert and oriented to person, place, and time.  Skin: Skin is warm and dry. No rash noted. She is not diaphoretic. No erythema. No pallor.       Assessment & Plan:  1. Acute bronchitis, unspecified organism Continue Mucinex with plenty of water. Plenty of rest. - chlorpheniramine-HYDROcodone (TUSSIONEX PENNKINETIC ER) 10-8 MG/5ML LQCR; Take 5 mLs by mouth every 12 (twelve) hours as needed for cough (  cough).  Dispense: 60 mL; Refill: 0 - albuterol (PROVENTIL HFA;VENTOLIN HFA) 108 (90 BASE) MCG/ACT inhaler; Inhale 2 puffs into the lungs every 4 (four) hours as needed for wheezing or shortness of breath (cough, shortness of breath or wheezing.).  Dispense: 1 Inhaler;  Refill: 1  2. Right-sided thoracic back pain Switch to Tylenol instead of ibuprofen as it interacts with psoriasis medication and BP slightly elevated in office. Cough medication has additional coverage for pain.    Janan Ridge PA-C  [facility] 07/25/2014 12:42 PM

## 2014-07-25 NOTE — Patient Instructions (Signed)

## 2014-07-28 ENCOUNTER — Telehealth: Payer: Self-pay

## 2014-07-28 NOTE — Telephone Encounter (Signed)
Patient wants to know if she can get a muscle relaxer and something for pain. She says she pulled a muscle from coughing so much

## 2014-07-29 NOTE — Telephone Encounter (Signed)
Pt called again regarding the same issue

## 2014-07-29 NOTE — Telephone Encounter (Signed)
The patient called again to ask about the muscle relaxer.  I relayed the information Benny Lennert gave, but the patient said that she was told that she would be prescribed a muscle relaxer during her visit on 06/24/14.  The patient also said that she was told not to use heat, but instead to use cold.  She said that she was also told not to continue OTC pain meds because her BP was elevated during her last visit.  Please advise.  Thank you.  CB#: [phone]

## 2014-07-29 NOTE — Telephone Encounter (Signed)
Unfortunately this is an additional problem that the patient needs to be evaluated for.  She can use OTC pain medications like Tylenol and Motrin and heat to help with her pain/discomfort.

## 2014-07-30 NOTE — Telephone Encounter (Signed)
LM for pt to RTC- Pt did complain of thoracic pain at OV 1/13- not 12/13 please advise if this was discussed during visit and can be called in for her if she does not RTC.

## 2014-07-30 NOTE — Telephone Encounter (Signed)
Spoke with patient to clarify initial instructions. Advised to switch from ibuprofen to tylenol as ibuprofen can interact with psoriasis meds. Advised to alternate heat and ice for shoulder pain. Does not need muscle relaxer at this time. Since cough has improved, but shoulder pain that was secondary to cough is still present should come in for further eval of shoulder. States she has finished Tussionex and overall feels better.

## 2015-06-03 ENCOUNTER — Encounter (HOSPITAL_COMMUNITY): Payer: Self-pay | Admitting: *Deleted

## 2015-06-03 ENCOUNTER — Emergency Department (HOSPITAL_COMMUNITY)
Admission: EM | Admit: 2015-06-03 | Discharge: 2015-06-04 | Disposition: A | Discharge status: Home / Self Care | Payer: Self-pay | Attending: Emergency Medicine | Admitting: Emergency Medicine

## 2015-06-03 DIAGNOSIS — Z3202 Encounter for pregnancy test, result negative: Secondary | ICD-10-CM | POA: Insufficient documentation

## 2015-06-03 DIAGNOSIS — Z880 Allergy status to penicillin: Secondary | ICD-10-CM | POA: Insufficient documentation

## 2015-06-03 DIAGNOSIS — Z87891 Personal history of nicotine dependence: Secondary | ICD-10-CM | POA: Insufficient documentation

## 2015-06-03 DIAGNOSIS — Z872 Personal history of diseases of the skin and subcutaneous tissue: Secondary | ICD-10-CM | POA: Insufficient documentation

## 2015-06-03 DIAGNOSIS — F329 Major depressive disorder, single episode, unspecified: Secondary | ICD-10-CM | POA: Insufficient documentation

## 2015-06-03 DIAGNOSIS — Z79899 Other long term (current) drug therapy: Secondary | ICD-10-CM | POA: Insufficient documentation

## 2015-06-03 DIAGNOSIS — F419 Anxiety disorder, unspecified: Secondary | ICD-10-CM | POA: Insufficient documentation

## 2015-06-03 DIAGNOSIS — N938 Other specified abnormal uterine and vaginal bleeding: Secondary | ICD-10-CM | POA: Insufficient documentation

## 2015-06-03 HISTORY — DX: Major depressive disorder, single episode, unspecified: F32.9

## 2015-06-03 HISTORY — DX: Depression, unspecified: F32.A

## 2015-06-03 HISTORY — DX: Psoriasis, unspecified: L40.9

## 2015-06-03 LAB — CBC WITH DIFFERENTIAL/PLATELET
Basophils Absolute: 0 10*3/uL (ref 0.0–0.1)
Basophils Relative: 0 %
EOS ABS: 0.1 10*3/uL (ref 0.0–0.7)
EOS PCT: 1 %
HCT: 42.5 % (ref 36.0–46.0)
HEMOGLOBIN: 14.3 g/dL (ref 12.0–15.0)
LYMPHS ABS: 2.2 10*3/uL (ref 0.7–4.0)
Lymphocytes Relative: 21 %
MCH: 30.9 pg (ref 26.0–34.0)
MCHC: 33.6 g/dL (ref 30.0–36.0)
MCV: 91.8 fL (ref 78.0–100.0)
MONO ABS: 0.7 10*3/uL (ref 0.1–1.0)
MONOS PCT: 6 %
NEUTROS PCT: 72 %
Neutro Abs: 7.7 10*3/uL (ref 1.7–7.7)
Platelets: 215 10*3/uL (ref 150–400)
RBC: 4.63 MIL/uL (ref 3.87–5.11)
RDW: 13 % (ref 11.5–15.5)
WBC: 10.7 10*3/uL — ABNORMAL HIGH (ref 4.0–10.5)

## 2015-06-03 LAB — I-STAT BETA HCG BLOOD, ED (MC, WL, AP ONLY)

## 2015-06-03 MED ORDER — KETOROLAC TROMETHAMINE 30 MG/ML IJ SOLN
60.0000 mg | Freq: Once | INTRAMUSCULAR | Status: AC
  Administered 2015-06-03: 60 mg via INTRAMUSCULAR
  Filled 2015-06-03: qty 2

## 2015-06-03 NOTE — ED Provider Notes (Addendum)
CSN: [id number]     Arrival date & time 06/03/15  2010 History  By signing my name below, I, Linda Hodge, attest that this documentation has been prepared under the direction and in the presence of Devoria Albe, MD at 2320. Electronically Signed: Angelene Giovanni, ED Scribe. 06/03/2015. 12:47 AM.    Chief Complaint  Patient presents with  . Vaginal Bleeding   The history is provided by the patient. No language interpreter was used.   HPI Comments: Linda Hodge is a 44 y.o. female, Linda Hodge presents to the Emergency Department complaining of ongoing intermittent abnormal vaginal bleeding onset 5 months ago.  She reports associated quarter-size clots, sometimes as big as a golf ball, fatigue, and going through a pad an hour. She denies any n/v, feeling dizzy, light headed,  SOB or CP. She has abdominal cramping. She states that she has been bleeding non-stop for three days now and is here because she felt increase "pressure" onset today. She states that she was placed on a sample pack of Loestrin birth control by her OB/GYN, Dr. Vincente Poli several weeks ago.She finished the pack and she started bleeding again 5 days ago.  She reports that her LNMP was approx 5 months ago and adds that before then, her menstrual cycle was regular.  She states that her mother has a hx of uterine fibroids.    Patient states she's on Xanax when necessary, co-syntyx for psoriasis for the past year, and Celexa 20 mg a day.  OBGYN: Dr. Vincente Poli  PCP: Dr. [facility]  Past Medical History  Diagnosis Date  . Anxiety   . Psoriasis   . Depression    Past Surgical History  Procedure Laterality Date  . Cesarean section      1995 and 2002   Family History  Problem Relation Age of Onset  . Fibromyalgia Mother   . Coronary artery disease Father   . Diabetes Father   . Diabetes Other    Social History  Substance Use Topics  . Smoking status: Former Smoker -- 15 years    Types:  Cigarettes  . Smokeless tobacco: Never Used  . Alcohol Use: Yes     Comment: a bottle of wine a night   Employed Smokes 1/2 ppd Drinks 1-2 bottles of wine on weekends and 1 during the week, drinks 4 days a week  OB History    No data available     Review of Systems  Constitutional: Positive for fatigue. Negative for fever.  Respiratory: Negative for shortness of breath.   Cardiovascular: Negative for chest pain.  Gastrointestinal: Negative for nausea and vomiting.  Genitourinary: Positive for vaginal bleeding and vaginal pain (pressure).  All other systems reviewed and are negative.     Allergies  Penicillins and Sulfa antibiotics  Home Medications   Prior to Admission medications   Medication Sig Start Date End Date Taking? Authorizing Provider  albuterol (PROVENTIL HFA;VENTOLIN HFA) 108 (90 BASE) MCG/ACT inhaler Inhale 2 puffs into the lungs every 4 (four) hours as needed for wheezing or shortness of breath (cough, shortness of breath or wheezing.). 07/25/14   Tishira R Brewington, PA-C  ALPRAZolam Prudy Feeler) 0.5 MG tablet Take 0.5 mg by mouth at bedtime as needed for anxiety.    Historical Provider, MD  chlorpheniramine-HYDROcodone (TUSSIONEX PENNKINETIC ER) 10-8 MG/5ML LQCR Take 5 mLs by mouth every 12 (twelve) hours as needed for cough (cough). 07/25/14   Tishira R Brewington, PA-C  Secukinumab (COSENTYX SENSOREADY PEN  [place]) Inject into the skin.    Historical Provider, MD   BP 151/84 mmHg  Pulse 75  Temp(Src) 98.6 F (37 C) (Oral)  Resp 17  SpO2 94%  LMP 05/31/2015  Vital signs normal except for hypertension  Physical Exam  Constitutional: She is oriented to person, place, and time. She appears well-developed and well-nourished.  Non-toxic appearance. She does not appear ill. No distress.  HENT:  Head: Normocephalic and atraumatic.  Right Ear: External ear normal.  Left Ear: External ear normal.  Nose: Nose normal. No mucosal edema or rhinorrhea.  Mouth/Throat:  Oropharynx is clear and moist and mucous membranes are normal. No dental abscesses or uvula swelling.  Eyes: Conjunctivae and EOM are normal. Pupils are equal, round, and reactive to light.  Neck: Normal range of motion and full passive range of motion without pain. Neck supple.  Cardiovascular: Normal rate, regular rhythm and normal heart sounds.  Exam reveals no gallop and no friction rub.   No murmur heard. Pulmonary/Chest: Effort normal and breath sounds normal. No respiratory distress. She has no wheezes. She has no rhonchi. She has no rales. She exhibits no tenderness and no crepitus.  Abdominal: Soft. Normal appearance and bowel sounds are normal. She exhibits no distension. There is no tenderness. There is no rebound and no guarding.  Musculoskeletal: Normal range of motion. She exhibits no edema or tenderness.  Moves all extremities well.   Neurological: She is alert and oriented to person, place, and time. She has normal strength. No cranial nerve deficit.  Skin: Skin is warm, dry and intact. No rash noted. No erythema. No pallor.  Psychiatric: She has a normal mood and affect. Her speech is normal and behavior is normal. Her mood appears not anxious.  Nursing note and vitals reviewed.   ED Course  Procedures (including critical care time)  Medications  ketorolac (TORADOL) 30 MG/ML injection 60 mg (60 mg Intramuscular Given 06/03/15 2350)    DIAGNOSTIC STUDIES: Oxygen Saturation is 97% on RA, adequate by my interpretation.    COORDINATION OF CARE: 11:33 PM- Pt advised of plan for treatment and pt agrees. Will receive Toradol for pain. Will consult Dr. Vincente Poli or on call OBGYN for further treatment and evaluation.   12:43 AM - Consultation with Dr. Rana Snare, GYN on call for Dr Vincente Poli, wants her to follow up in office. Advised to take 1-2 BCP pills a day to try to stop the bleeding.   Pt does not want an [place] to be done tonight because she doesn't have insurance until Jan.    Labs  Review Results for orders placed or performed during the hospital encounter of 06/03/15  CBC with Differential  Result Value Ref Range   WBC 10.7 (H) 4.0 - 10.5 K/uL   RBC 4.63 3.87 - 5.11 MIL/uL   Hemoglobin 14.3 12.0 - 15.0 g/dL   HCT 16.1 09.6 - 04.5 %   MCV 91.8 78.0 - 100.0 fL   MCH 30.9 26.0 - 34.0 pg   MCHC 33.6 30.0 - 36.0 g/dL   RDW 40.9 81.1 - 91.4 %   Platelets 215 150 - 400 K/uL   Neutrophils Relative % 72 %   Neutro Abs 7.7 1.7 - 7.7 K/uL   Lymphocytes Relative 21 %   Lymphs Abs 2.2 0.7 - 4.0 K/uL   Monocytes Relative 6 %   Monocytes Absolute 0.7 0.1 - 1.0 K/uL   Eosinophils Relative 1 %   Eosinophils Absolute 0.1 0.0 - 0.7 K/uL  Basophils Relative 0 %   Basophils Absolute 0.0 0.0 - 0.1 K/uL  I-Stat beta hCG blood, ED ([facility], [facility], [facility] only)  Result Value Ref Range   I-stat hCG, quantitative <5.0 <5 mIU/mL   Comment 3           Laboratory interpretation all normal     Imaging Review No results found.   Devoria Albe, MD has personally reviewed and evaluated lab results as part of her medical decision-making.   EKG Interpretation None      MDM   patient presents with abnormal vaginal bleeding however she is not anemic. She was discussed for GYN. She was placed back on birth control pills and is to follow-up with her GYN in the office.    Final diagnoses:  None   New Prescriptions   NORETHINDRONE ACETATE-ETHINYL ESTRADIOL (JUNEL,LOESTRIN,MICROGESTIN) 1.5-30 MG-MCG TABLET    Take 1 tablet by mouth daily.    Plan discharge  Devoria Albe, MD, FACEP   I personally performed the services described in this documentation, which was scribed in my presence. The recorded information has been reviewed and considered.  Devoria Albe, MD, FACEPDevoria Albe, MD 06/04/15 1610  Devoria Albe, MD 06/04/15 [phone]

## 2015-06-03 NOTE — ED Notes (Signed)
Pt states that she has been having abnormal vaginal bleeding for the last 6 weeks; pt states that she saw her OB / GYN a couple of weeks ago and was prescribed a few days of birth control pills; pt states that her periods have been heavier and have had clots for the last 6 weeks and that they last for 2-3 weeks and then only has approx a week that she is off her period; pt states that last night the bleeding increased; pt states "it is like a faucet running out"; pt states that she is going through a pad an hour; pt states that she is feeling increase pressure to her vagina and that when she stands or coughs and sneezes she passes more blood

## 2015-06-04 MED ORDER — NORETHINDRONE ACET-ETHINYL EST 1.5-30 MG-MCG PO TABS: 1.0000 | ORAL_TABLET | Freq: Every day | ORAL | Status: AC

## 2015-06-04 NOTE — Discharge Instructions (Signed)
I talked to Dr Lynnell Dike partner tonight. He wants you to restart the birth control pills and take 1 or 2 daily as needed to stop the bleeding. He wants you to call the office to be seen.    Dysfunctional Uterine Bleeding Dysfunctional uterine bleeding is abnormal bleeding from the uterus. Dysfunctional uterine bleeding includes:  A period that comes earlier or later than usual.  A period that is lighter, heavier, or has blood clots.  Bleeding between periods.  Skipping one or more periods.  Bleeding after sexual intercourse.  Bleeding after menopause. HOME CARE INSTRUCTIONS  Pay attention to any changes in your symptoms. Follow these instructions to help with your condition: Eating  Eat well-balanced meals. Include foods that are high in iron, such as liver, meat, shellfish, green leafy vegetables, and eggs.  If you become constipated:  Drink plenty of water.  Eat fruits and vegetables that are high in water and fiber, such as spinach, carrots, raspberries, apples, and mango. Medicines  Take over-the-counter and prescription medicines only as told by your health care provider.  Do not change medicines without talking with your health care provider.  Aspirin or medicines that contain aspirin may make the bleeding worse. Do not take those medicines:  During the week before your period.  During your period.  If you were prescribed iron pills, take them as told by your health care provider. Iron pills help to replace iron that your body loses because of this condition. Activity  If you need to change your sanitary pad or tampon more than one time every 2 hours:  Lie in bed with your feet raised (elevated).  Place a cold pack on your lower abdomen.  Rest as much as possible until the bleeding stops or slows down.  Do not try to lose weight until the bleeding has stopped and your blood iron level is back to normal. Other Instructions  For two months, write  down:  When your period starts.  When your period ends.  When any abnormal bleeding occurs.  What problems you notice.  Keep all follow up visits as told by your health care provider. This is important. SEEK MEDICAL CARE IF:  You get light-headed or weak.  You have nausea and vomiting.  You cannot eat or drink without vomiting.  You feel dizzy or have diarrhea while you are taking medicines.  You are taking birth control pills or hormones, and you want to change them or stop taking them. SEEK IMMEDIATE MEDICAL CARE IF:  You develop a fever or chills.  You need to change your sanitary pad or tampon more than one time per hour.  Your bleeding becomes heavier, or your flow contains clots more often.  You develop pain in your abdomen.  You lose consciousness.  You develop a rash.   This information is not intended to replace advice given to you by your health care provider. Make sure you discuss any questions you have with your health care provider.   Document Released: 06/26/2000 Document Revised: 03/20/2015 Document Reviewed: 09/24/2014 Elsevier Interactive Patient Education [employer]

## 2015-07-19 ENCOUNTER — Other Ambulatory Visit: Payer: Self-pay | Admitting: Family Medicine

## 2015-07-19 DIAGNOSIS — R1011 Right upper quadrant pain: Secondary | ICD-10-CM

## 2015-07-23 ENCOUNTER — Other Ambulatory Visit: Payer: Self-pay

## 2015-07-26 ENCOUNTER — Other Ambulatory Visit: Payer: Self-pay

## 2015-07-26 ENCOUNTER — Ambulatory Visit
Admission: RE | Admit: 2015-07-26 | Discharge: 2015-07-26 | Disposition: A | Discharge status: Home / Self Care | Payer: BLUE CROSS/BLUE SHIELD | Source: Ambulatory Visit | Attending: Family Medicine | Admitting: Family Medicine

## 2015-07-26 DIAGNOSIS — R1011 Right upper quadrant pain: Secondary | ICD-10-CM

## 2015-07-26 IMAGING — US US ABDOMEN LIMITED
1 series · 14 of 25 positions shown · non-contrast
Comparison: No prior.

CLINICAL DATA: Right upper quadrant pain.

EXAM:
US ABDOMEN LIMITED - RIGHT UPPER QUADRANT

[Series 1: us abdomen limited · 0.30mm/px · 14 of 42 slices shown]
[im 1/42]
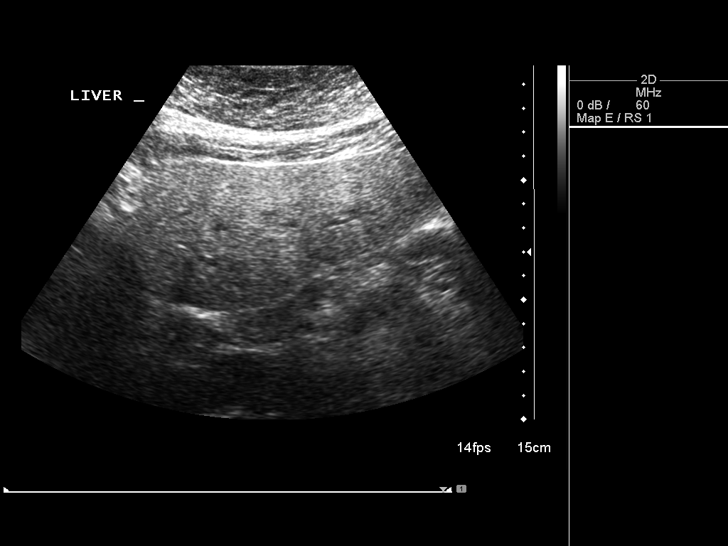
[im 4/42]
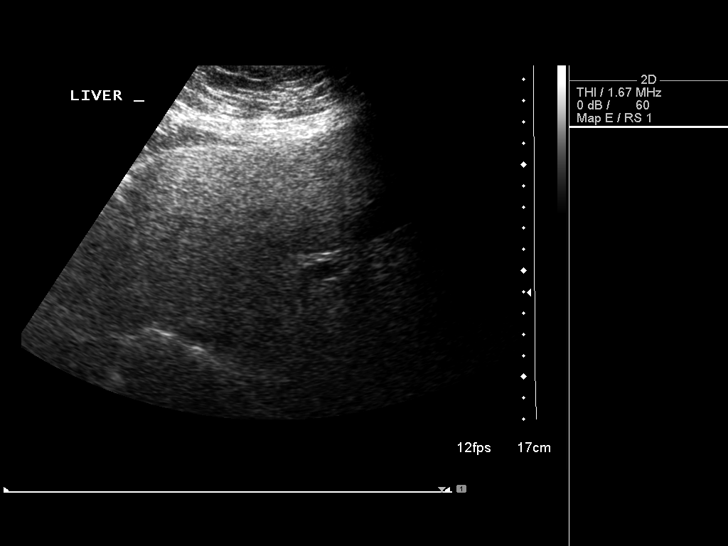
[im 7/42]
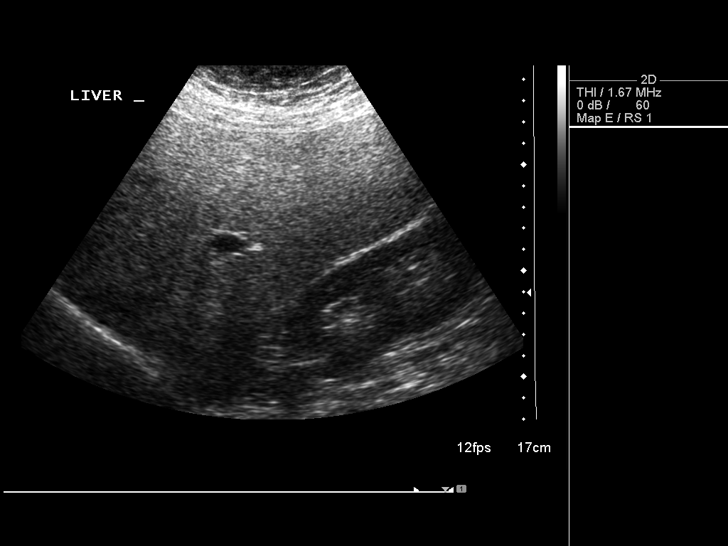
[im 11/42]
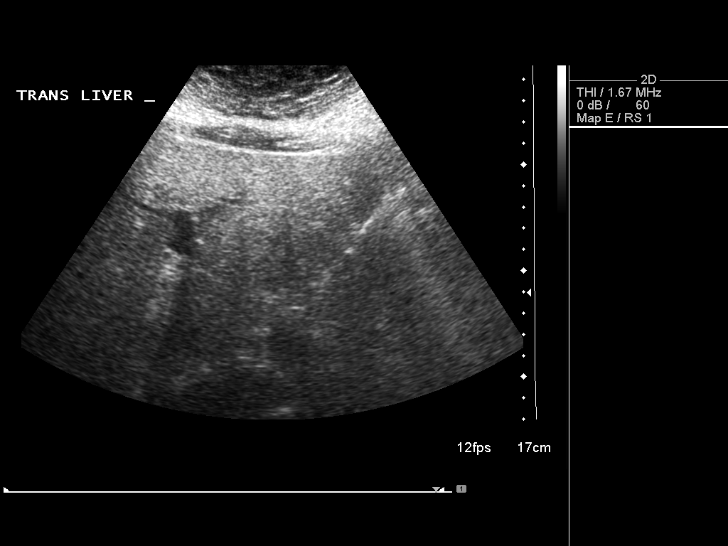
[im 14/42]
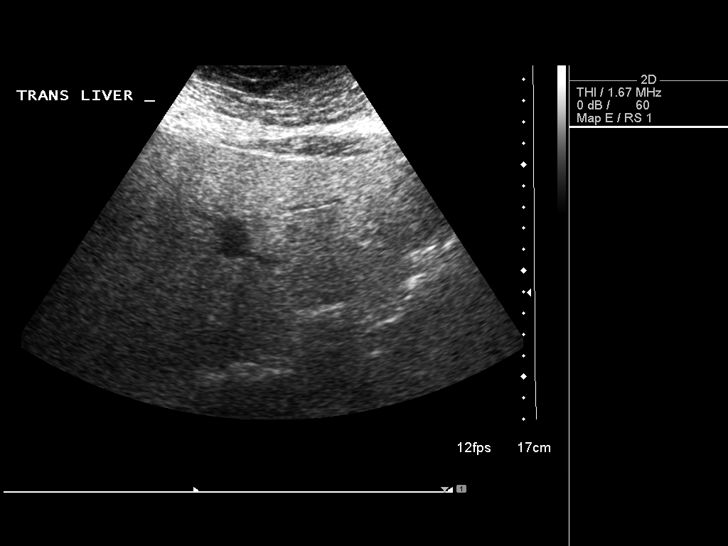
[im 16/42]
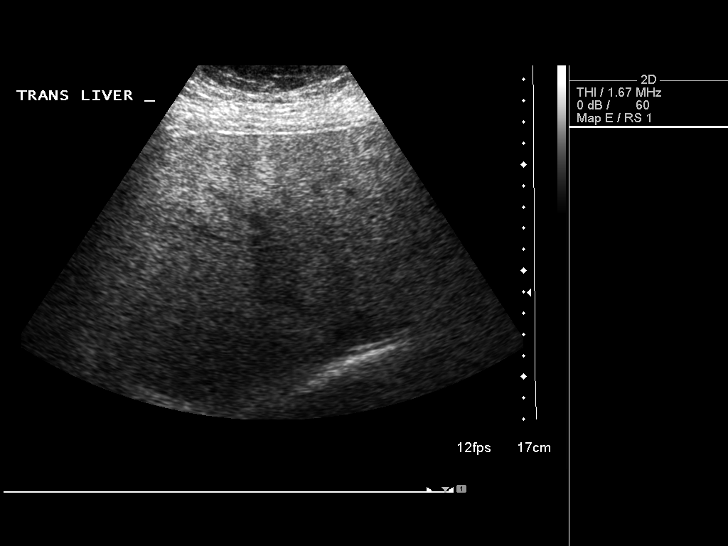
[im 19/42]
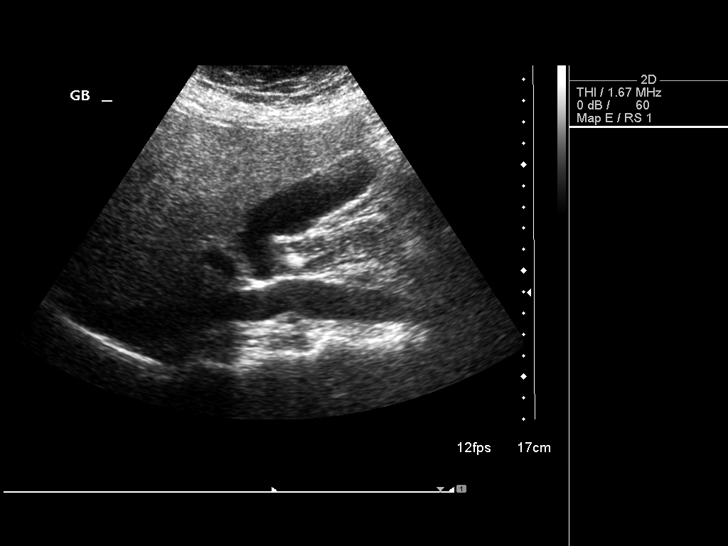
[im 23/42]
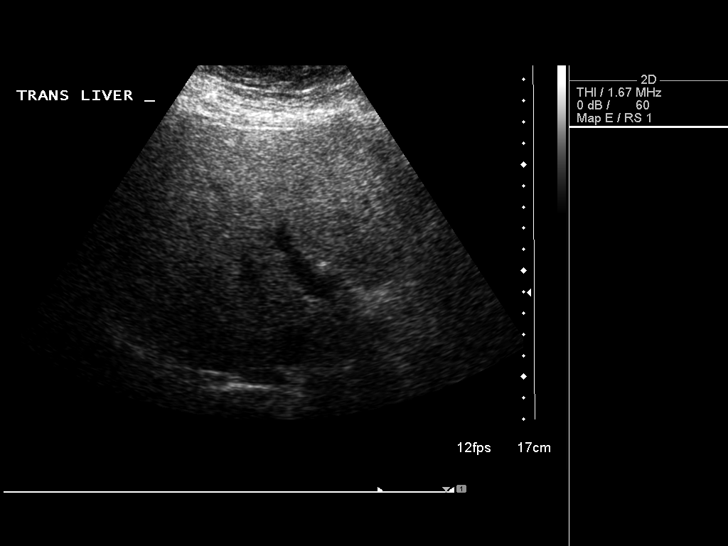
[im 26/42]
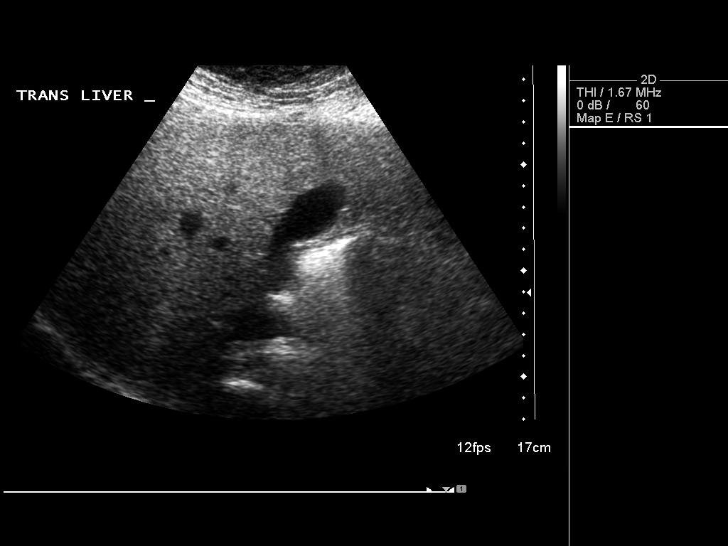
[im 28/42]
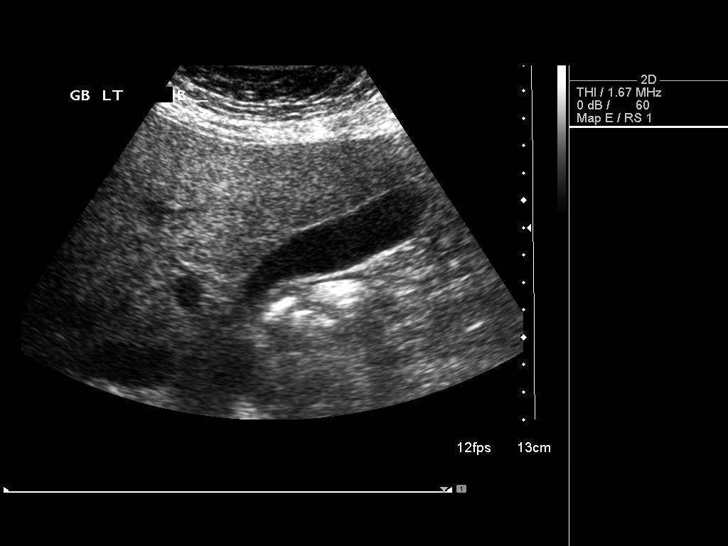
[im 31/42]
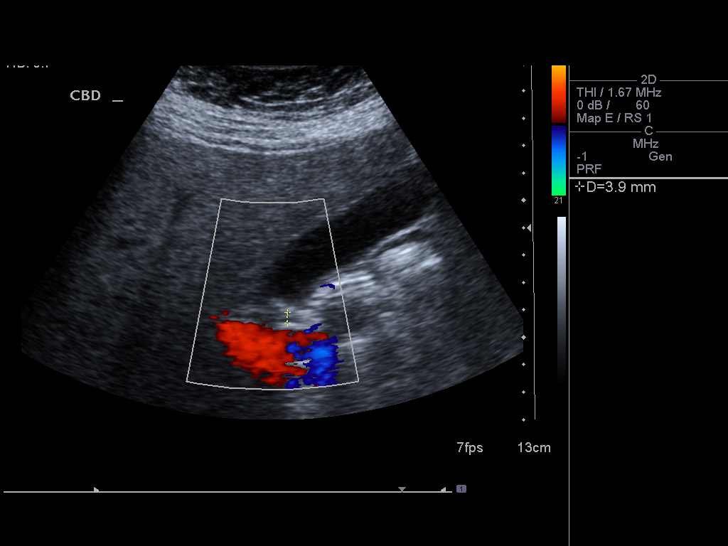
[im 35/42]
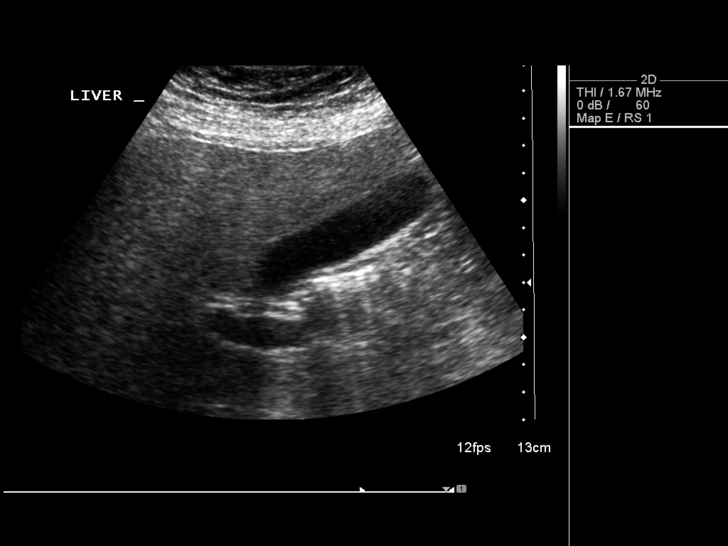
[im 38/42]
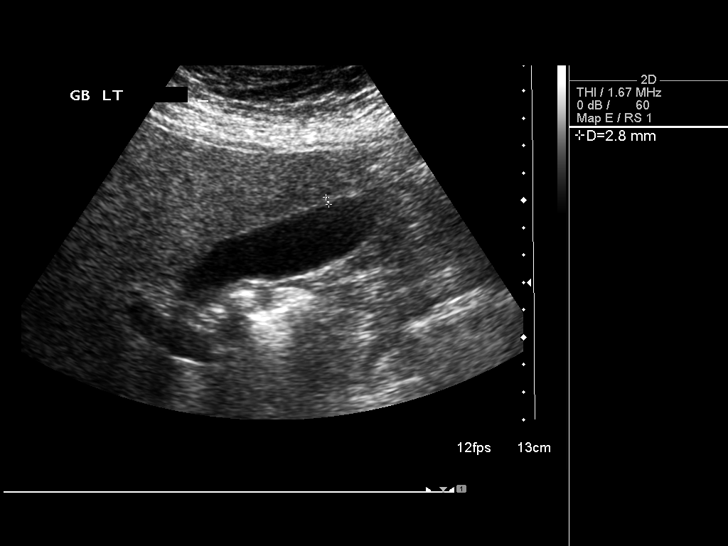
[im 42/42]
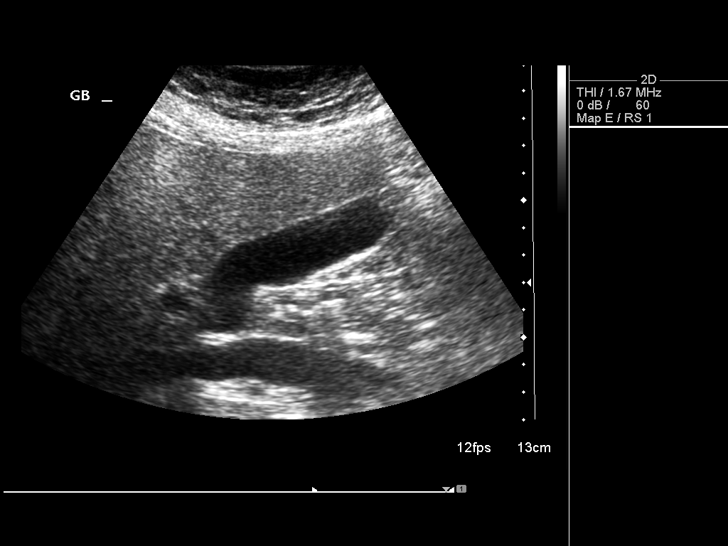

[14 of 25 positions shown; findings below may reference images not displayed]

FINDINGS: Gallbladder:

No gallstones or wall thickening visualized. No sonographic Murphy
sign noted by sonographer.

Common bile duct:

Diameter: 3.9 mm

Liver:

There is echogenic consistent fatty infiltration and/or
hepatocellular disease.
IMPRESSION: Liver is echogenic consistent fatty infiltration and/or
hepatocellular disease. No focal abnormality identified.

## 2015-11-22 ENCOUNTER — Other Ambulatory Visit (HOSPITAL_COMMUNITY): Payer: Self-pay | Admitting: Physician Assistant

## 2015-11-22 DIAGNOSIS — R112 Nausea with vomiting, unspecified: Secondary | ICD-10-CM

## 2015-11-22 DIAGNOSIS — R1011 Right upper quadrant pain: Secondary | ICD-10-CM

## 2015-12-06 ENCOUNTER — Ambulatory Visit (HOSPITAL_COMMUNITY)
Admission: RE | Admit: 2015-12-06 | Discharge: 2015-12-06 | Disposition: A | Discharge status: Home / Self Care | Payer: BLUE CROSS/BLUE SHIELD | Source: Ambulatory Visit | Attending: Physician Assistant | Admitting: Physician Assistant

## 2015-12-06 DIAGNOSIS — R1011 Right upper quadrant pain: Secondary | ICD-10-CM | POA: Diagnosis present

## 2015-12-06 DIAGNOSIS — R112 Nausea with vomiting, unspecified: Secondary | ICD-10-CM | POA: Insufficient documentation

## 2015-12-06 IMAGING — NM NM HEPATO W/GB/PHARM/[PERSON_NAME]
1 series · 12 of 12 positions shown · non-contrast
Comparison: Ultrasound [DATE]

CLINICAL DATA: Right upper quadrant pain, nausea and vomiting for 6
months

EXAM:
NUCLEAR MEDICINE HEPATOBILIARY IMAGING WITH GALLBLADDER EF
TECHNIQUE: Sequential images of the abdomen were obtained [DATE] minutes
following intravenous administration of radiopharmaceutical. After
oral ingestion of Ensure, gallbladder ejection fraction was
determined. At 60 min, normal ejection fraction is greater than 33%.
RADIOPHARMACEUTICALS:  5.2 mCi [B4]  Choletec IV

[Series 1: hepato · 4.46mm/px · 2 acquisitions, 12 frames shown]
[im 1/2]
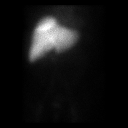
[im 1/2]
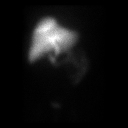
[im 1/2]
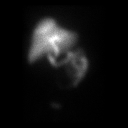
[im 1/2]
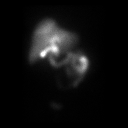
[im 1/2]
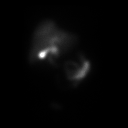
[im 1/2]
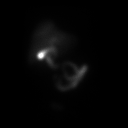
[im 2/2]
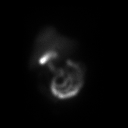
[im 2/2]
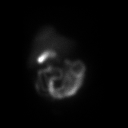
[im 2/2]
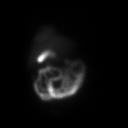
[im 2/2]
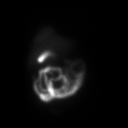
[im 2/2]
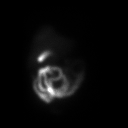
[im 2/2]
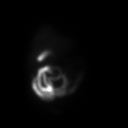

[12 of 12 positions shown; findings below may reference images not displayed]

FINDINGS: Prompt uptake and biliary excretion of activity by the liver is
seen. Gallbladder activity is visualized, consistent with patency of
cystic duct. Biliary activity passes into small bowel, consistent
with patent common bile duct.

Calculated gallbladder ejection fraction is 35.3%. (Normal
gallbladder ejection fraction with Ensure is greater than 33%.)
IMPRESSION: No cystic duct obstruction. No CBD obstruction. Post Ensure
gallbladder ejection fraction is 35.3%.

The patient had no symptoms after Ensure ingestion.

## 2015-12-06 MED ORDER — TECHNETIUM TC 99M MEBROFENIN IV KIT
5.2000 | PACK | Freq: Once | INTRAVENOUS | Status: AC | PRN
  Administered 2015-12-06: 5.2 via INTRAVENOUS

## 2016-05-14 ENCOUNTER — Encounter (HOSPITAL_COMMUNITY): Payer: Self-pay | Admitting: *Deleted

## 2016-05-14 ENCOUNTER — Emergency Department (HOSPITAL_COMMUNITY)
Admission: EM | Admit: 2016-05-14 | Discharge: 2016-05-14 | Disposition: A | Discharge status: Home / Self Care | Payer: BLUE CROSS/BLUE SHIELD | Attending: Emergency Medicine | Admitting: Emergency Medicine

## 2016-05-14 DIAGNOSIS — Z79899 Other long term (current) drug therapy: Secondary | ICD-10-CM | POA: Diagnosis not present

## 2016-05-14 DIAGNOSIS — R1084 Generalized abdominal pain: Secondary | ICD-10-CM | POA: Diagnosis present

## 2016-05-14 DIAGNOSIS — R1013 Epigastric pain: Secondary | ICD-10-CM

## 2016-05-14 DIAGNOSIS — Z87891 Personal history of nicotine dependence: Secondary | ICD-10-CM | POA: Insufficient documentation

## 2016-05-14 DIAGNOSIS — G43A0 Cyclical vomiting, not intractable: Secondary | ICD-10-CM | POA: Diagnosis not present

## 2016-05-14 LAB — URINE MICROSCOPIC-ADD ON

## 2016-05-14 LAB — URINALYSIS, ROUTINE W REFLEX MICROSCOPIC
GLUCOSE, UA: 100 mg/dL — AB
HGB URINE DIPSTICK: NEGATIVE
Ketones, ur: 15 mg/dL — AB
Nitrite: NEGATIVE
Protein, ur: NEGATIVE mg/dL
SPECIFIC GRAVITY, URINE: 1.026 (ref 1.005–1.030)
pH: 6.5 (ref 5.0–8.0)

## 2016-05-14 LAB — COMPREHENSIVE METABOLIC PANEL
ALT: 48 U/L (ref 14–54)
ANION GAP: 11 (ref 5–15)
AST: 75 U/L — ABNORMAL HIGH (ref 15–41)
Albumin: 3.9 g/dL (ref 3.5–5.0)
Alkaline Phosphatase: 37 U/L — ABNORMAL LOW (ref 38–126)
BUN: 9 mg/dL (ref 6–20)
CHLORIDE: 104 mmol/L (ref 101–111)
CO2: 21 mmol/L — AB (ref 22–32)
Calcium: 9.5 mg/dL (ref 8.9–10.3)
Creatinine, Ser: 0.68 mg/dL (ref 0.44–1.00)
GFR calc non Af Amer: >60 mL/min (ref >60)
Glucose, Bld: 149 mg/dL — ABNORMAL HIGH (ref 65–99)
POTASSIUM: 3.8 mmol/L (ref 3.5–5.1)
SODIUM: 136 mmol/L (ref 135–145)
Total Bilirubin: 1.6 mg/dL — ABNORMAL HIGH (ref 0.3–1.2)
Total Protein: 6.8 g/dL (ref 6.5–8.1)

## 2016-05-14 LAB — LIPASE, BLOOD: LIPASE: 27 U/L (ref 11–51)

## 2016-05-14 LAB — CBC
HEMATOCRIT: 45.4 % (ref 36.0–46.0)
HEMOGLOBIN: 15.5 g/dL — AB (ref 12.0–15.0)
MCH: 31.5 pg (ref 26.0–34.0)
MCHC: 34.1 g/dL (ref 30.0–36.0)
MCV: 92.3 fL (ref 78.0–100.0)
Platelets: 176 10*3/uL (ref 150–400)
RBC: 4.92 MIL/uL (ref 3.87–5.11)
RDW: 13.2 % (ref 11.5–15.5)
WBC: 9.3 10*3/uL (ref 4.0–10.5)

## 2016-05-14 LAB — RAPID URINE DRUG SCREEN, HOSP PERFORMED
Amphetamines: NOT DETECTED
BARBITURATES: NOT DETECTED
BENZODIAZEPINES: POSITIVE — AB
COCAINE: NOT DETECTED
OPIATES: NOT DETECTED
TETRAHYDROCANNABINOL: POSITIVE — AB

## 2016-05-14 LAB — I-STAT BETA HCG BLOOD, ED (MC, WL, AP ONLY): I-stat hCG, quantitative: <5 m[IU]/mL (ref <5)

## 2016-05-14 MED ORDER — METOCLOPRAMIDE HCL 5 MG/ML IJ SOLN
10.0000 mg | Freq: Once | INTRAMUSCULAR | Status: AC
  Administered 2016-05-14: 10 mg via INTRAVENOUS
  Filled 2016-05-14: qty 2

## 2016-05-14 MED ORDER — SODIUM CHLORIDE 0.9 % IV SOLN
Freq: Once | INTRAVENOUS | Status: AC
  Administered 2016-05-14: 12:00:00 via INTRAVENOUS

## 2016-05-14 MED ORDER — METOCLOPRAMIDE HCL 10 MG PO TABS: 10.0000 mg | ORAL_TABLET | Freq: Four times a day (QID) | ORAL | 0 refills | Status: AC

## 2016-05-14 MED ORDER — DIPHENHYDRAMINE HCL 50 MG/ML IJ SOLN
12.5000 mg | Freq: Once | INTRAMUSCULAR | Status: AC
  Administered 2016-05-14: 12.5 mg via INTRAVENOUS
  Filled 2016-05-14: qty 1

## 2016-05-14 MED ORDER — HYDROMORPHONE HCL 2 MG/ML IJ SOLN
1.0000 mg | Freq: Once | INTRAMUSCULAR | Status: AC
  Administered 2016-05-14: 1 mg via INTRAVENOUS
  Filled 2016-05-14: qty 1

## 2016-05-14 NOTE — Discharge Instructions (Signed)
Return if any problems.  See your Physician for recheck  °

## 2016-05-14 NOTE — ED Triage Notes (Signed)
Pt reports having episodes of abd pain and n/v for over past year. reports not eating and still able to vomit large amounts of bile. Also has diarrhea and weight loss.

## 2016-05-14 NOTE — ED Provider Notes (Signed)
[facility]-EMERGENCY DEPT Provider Note   CSN: [id number] Arrival date & time: 05/14/16  1114     History   Chief Complaint Chief Complaint  Patient presents with  . Abdominal Pain  . Emesis    HPI Linda Hodge is a 45 y.o. female.  The history is provided by the patient. No language interpreter was used.  Abdominal Pain   This is a new problem. The current episode started yesterday. The problem has been gradually worsening. The pain is associated with an unknown factor. The pain is located in the generalized abdominal region. The pain is moderate. Associated symptoms include vomiting. Nothing aggravates the symptoms. Nothing relieves the symptoms. Past workup includes GI consult. Her past medical history does not include gallstones.  Emesis   Associated symptoms include abdominal pain.  Pt reports she has been having abdominal pain on and off for a year. Pt reports she has multiple episodes of vomiting.  Pt has been seen by Gi.  Pt had a Hida scan which was borderline.    Past Medical History:  Diagnosis Date  . Anxiety   . Depression   . Psoriasis     Patient Active Problem List   Diagnosis Date Noted  . Psoriasis 08/01/2012  . BMI 35.0-35.9,adult 08/01/2012    Past Surgical History:  Procedure Laterality Date  . CESAREAN SECTION     1995 and 2002    OB History    No data available       Home Medications    Prior to Admission medications   Medication Sig Start Date End Date Taking? Authorizing Provider  albuterol (PROVENTIL HFA;VENTOLIN HFA) 108 (90 BASE) MCG/ACT inhaler Inhale 2 puffs into the lungs every 4 (four) hours as needed for wheezing or shortness of breath (cough, shortness of breath or wheezing.). 07/25/14   Tishira R Brewington, PA-C  ALPRAZolam Prudy Feeler(XANAX) 0.5 MG tablet Take 0.5 mg by mouth at bedtime as needed for anxiety.    Historical Provider, MD  chlorpheniramine-HYDROcodone (TUSSIONEX PENNKINETIC ER) 10-8 MG/5ML LQCR Take 5 mLs by mouth every 12  (twelve) hours as needed for cough (cough). 07/25/14   Tishira R Brewington, PA-C  Norethindrone Acetate-Ethinyl Estradiol (JUNEL,LOESTRIN,MICROGESTIN) 1.5-30 MG-MCG tablet Take 1 tablet by mouth daily. 06/04/15   Devoria AlbeIva Knapp, MD  Secukinumab (COSENTYX SENSOREADY PEN [place]) Inject into the skin.    Historical Provider, MD    Family History Family History  Problem Relation Age of Onset  . Fibromyalgia Mother   . Coronary artery disease Father   . Diabetes Father   . Diabetes Other     Social History Social History  Substance Use Topics  . Smoking status: Former Smoker    Years: 15.00    Types: Cigarettes  . Smokeless tobacco: Never Used  . Alcohol use Yes     Comment: a bottle of wine a night     Allergies   Penicillins and Sulfa antibiotics   Review of Systems Review of Systems  Gastrointestinal: Positive for abdominal pain and vomiting.  All other systems reviewed and are negative.    Physical Exam Updated Vital Signs BP 175/87 (BP Location: Left Arm)   Pulse 72   Temp 98.1 F (36.7 C) (Oral)   Resp 17   LMP 04/16/2016   SpO2 99%   Physical Exam  Constitutional: She appears well-developed and well-nourished. No distress.  HENT:  Head: Normocephalic and atraumatic.  Eyes: Conjunctivae are normal.  Neck: Neck supple.  Cardiovascular: Normal rate and regular rhythm.  No murmur heard. Pulmonary/Chest: Effort normal and breath sounds normal. No respiratory distress.  Abdominal: Soft. She exhibits no distension and no mass. There is tenderness. There is no guarding.  Musculoskeletal: She exhibits no edema.  Neurological: She is alert.  Skin: Skin is warm and dry.  Psychiatric: She has a normal mood and affect.  Nursing note and vitals reviewed.    ED Treatments / Results  Labs (all labs ordered are listed, but only abnormal results are displayed) Labs Reviewed  COMPREHENSIVE METABOLIC PANEL - Abnormal; Notable for the following:       Result Value   CO2 21  (*)    Glucose, Bld 149 (*)    AST 75 (*)    Alkaline Phosphatase 37 (*)    Total Bilirubin 1.6 (*)    All other components within normal limits  CBC - Abnormal; Notable for the following:    Hemoglobin 15.5 (*)    All other components within normal limits  LIPASE, BLOOD  URINALYSIS, ROUTINE W REFLEX MICROSCOPIC (NOT AT [facility])  RAPID URINE DRUG SCREEN, HOSP PERFORMED  I-STAT BETA HCG BLOOD, ED ([facility], [facility], [facility] ONLY)    EKG  EKG Interpretation None       Radiology No results found.  Procedures Procedures (including critical care time)  Medications Ordered in ED Medications  0.9 %  sodium chloride infusion ( Intravenous New Bag/Given 05/14/16 1226)     Initial Impression / Assessment and Plan / ED Course  I have reviewed the triage vital signs and the nursing notes.  Pertinent labs & imaging results that were available during my care of the patient were reviewed by me and considered in my medical decision making (see chart for details).  Clinical Course  Value Comment By Time  Alkaline Phosphatase: (!) 37 (Reviewed) Lonia Skinner [place], PA-C 11/02 1543  CO2: (!) 21 (Reviewed) Lonia Skinner [place], PA-C 11/02 1545  AST: (!) 75 (Reviewed) Elson Areas, PA-C 11/02 1546  Total Bilirubin: (!) 1.6 (Reviewed) Elson Areas, PA-C 11/02 1546    Pt given IV fluids, Reglan and benadryl iv.  Pt has elevated blood pressure which resolved with fluids and nausea medications. Pt has elevated glucose.  She is advise she will need recheck.  UA few bacteria.  Pt has slight elevation in lft's.  She has a history of fatty lover.   Pt denies history of high  blood pressure.  Pt advised to see her Gi doctor for recheck. I advised pt she may still have gallbladder disease and that she needs further evaluation.  Pt has an appointment to see Dr. Dulce Sellar.  Pt does not have an acute abdomen.  I do not feel she needs a ct currently.   Pt advised to avoid THC.  Final Clinical Impressions(s) / ED Diagnoses   Final  diagnoses:  Cyclical vomiting with nausea, intractability of vomiting not specified    New Prescriptions New Prescriptions   METOCLOPRAMIDE (REGLAN) 10 MG TABLET    Take 1 tablet (10 mg total) by mouth every 6 (six) hours.     Elson Areas, PA-C 05/14/16 1529    Elson Areas, PA-C 05/14/16 1555    Lonia Skinner [place], PA-C 05/14/16 1555    Charlynne Pander, MD 05/15/16 [phone]

## 2016-05-14 NOTE — ED Notes (Signed)
Prescriptions and papers reviewed with patient and family member. Express intent to follow up with GI and leaving with decreased nausea.

## 2016-09-15 ENCOUNTER — Other Ambulatory Visit: Payer: Self-pay | Admitting: Surgery

## 2019-05-18 ENCOUNTER — Other Ambulatory Visit: Payer: Self-pay

## 2019-05-18 DIAGNOSIS — Z20822 Contact with and (suspected) exposure to covid-19: Secondary | ICD-10-CM

## 2019-05-20 LAB — NOVEL CORONAVIRUS, NAA: SARS-CoV-2, NAA: NOT DETECTED

## 2020-03-07 ENCOUNTER — Other Ambulatory Visit: Payer: BLUE CROSS/BLUE SHIELD

## 2021-02-06 ENCOUNTER — Other Ambulatory Visit: Payer: Self-pay | Admitting: Gastroenterology

## 2021-02-06 DIAGNOSIS — R112 Nausea with vomiting, unspecified: Secondary | ICD-10-CM

## 2021-02-24 ENCOUNTER — Ambulatory Visit
Admission: RE | Admit: 2021-02-24 | Discharge: 2021-02-24 | Disposition: A | Discharge status: Home / Self Care | Payer: 59 | Source: Ambulatory Visit | Attending: Gastroenterology | Admitting: Gastroenterology

## 2021-02-24 ENCOUNTER — Other Ambulatory Visit: Payer: Self-pay

## 2021-02-24 DIAGNOSIS — R112 Nausea with vomiting, unspecified: Secondary | ICD-10-CM

## 2021-02-24 IMAGING — MR MR HEAD WO/W CM
13 series · 48 of 48 positions shown · IV contrast (multihance)
Comparison: Head CT [DATE].

CLINICAL DATA: Nausea and vomiting, intractability of vomiting not
specified, unspecified vomiting type [37] ([37]-CM). Additional
history provided by scanning technologist: Patient reports chronic
nausea/vomiting since [37].

EXAM:
MRI HEAD WITHOUT AND WITH CONTRAST
TECHNIQUE: Multiplanar, multiecho pulse sequences of the brain and surrounding
structures were obtained without and with intravenous contrast.
CONTRAST:  20mL MULTIHANCE GADOBENATE DIMEGLUMINE 529 MG/ML IV SOLN

[Series 2: T1 · sagittal · 5.0mm · 0.45mm/px · 2 of 25 slices shown]
[im 1/25]
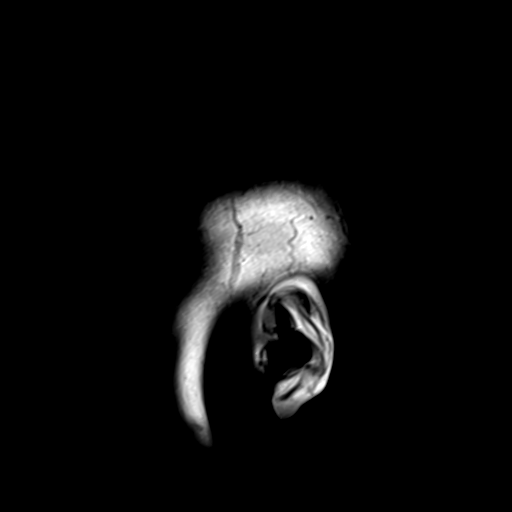
[im 25/25]
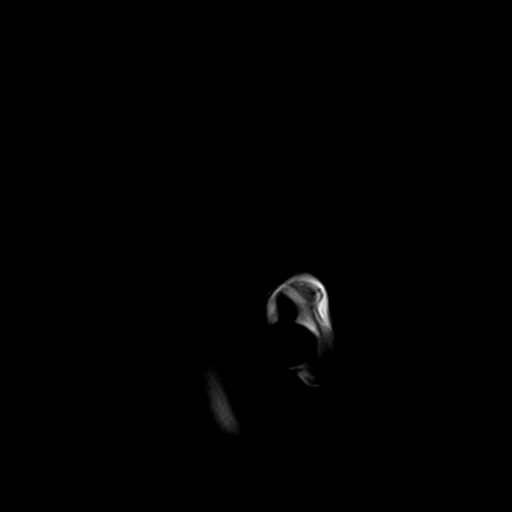

[Series 3: ax ep2d_diff_3 · axial · 3.0mm · 1.80mm/px · z∈[-58,+101]mm · 6 of 109 slices shown]
[im 1/109]
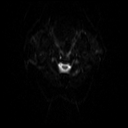
[im 22/109]
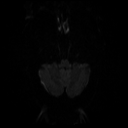
[im 44/109]
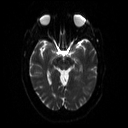
[im 65/109]
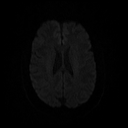
[im 87/109]
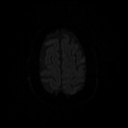
[im 109/109]
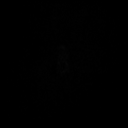

[Series 4: ax ep2d_diff_3_adc · axial · 3.0mm · 1.80mm/px · z∈[-58,+101]mm · 3 of 53 slices shown]
[im 1/53]
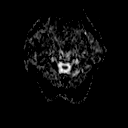
[im 27/53]
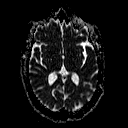
[im 53/53]
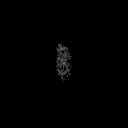

[Series 5: cor ep2d_diff · coronal · 5.0mm · 1.77mm/px · 3 of 60 slices shown]
[im 1/60]
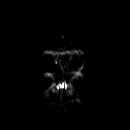
[im 30/60]
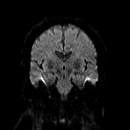
[im 60/60]
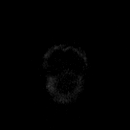

[Series 6: cor ep2d_diff_adc · coronal · 5.0mm · 1.77mm/px · 2 of 30 slices shown]
[im 1/30]
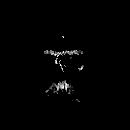
[im 30/30]
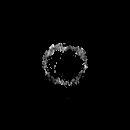

[Series 8: swi_images · axial · 2.0mm · 0.98mm/px · z∈[-58,+97]mm · 5 of 80 slices shown]
[im 1/80]
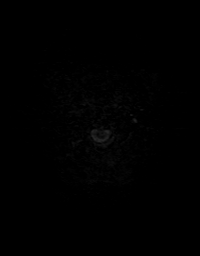
[im 20/80]
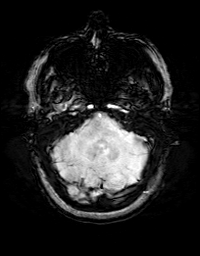
[im 40/80]
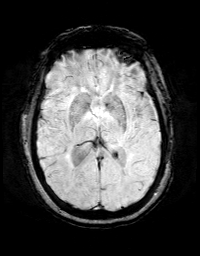
[im 60/80]
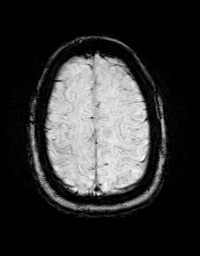
[im 80/80]
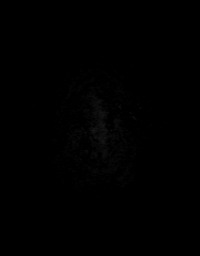

[Series 9: FLAIR · axial · 3.0mm · 0.43mm/px · z∈[-52,+97]mm · 2 of 40 slices shown (1 of 2)]
[im 1/40]
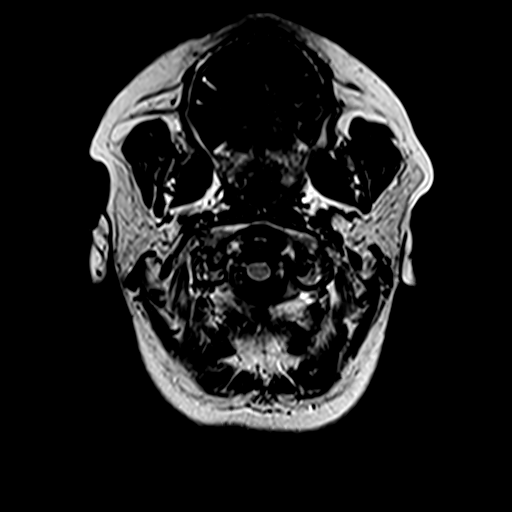
[im 40/40]
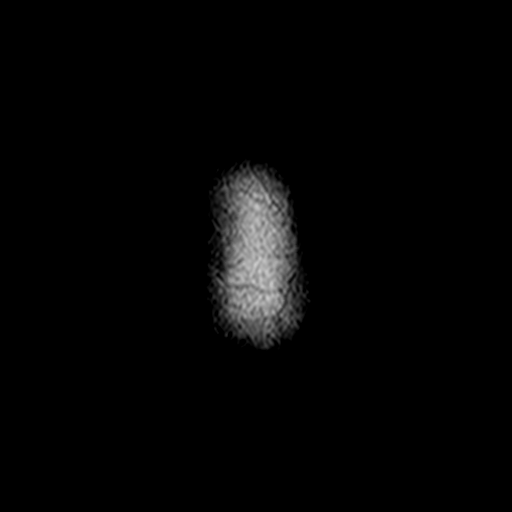

[Series 10: T2 · axial · 5.0mm · 0.65mm/px · z∈[-63,+101]mm · 2 of 29 slices shown (1 of 2)]
[im 1/29]
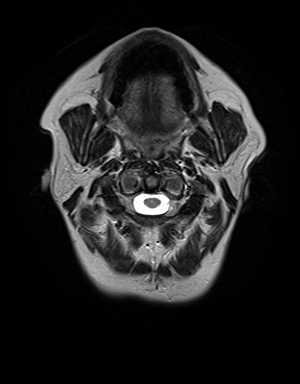
[im 29/29]
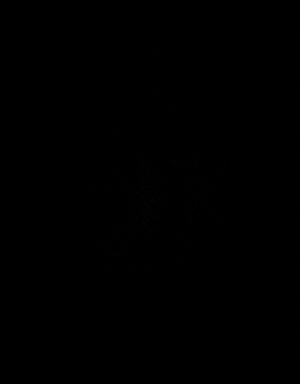

[Series 11: FLAIR · sagittal · 3.0mm · 0.43mm/px · 2 of 40 slices shown (2 of 2)]
[im 1/40]
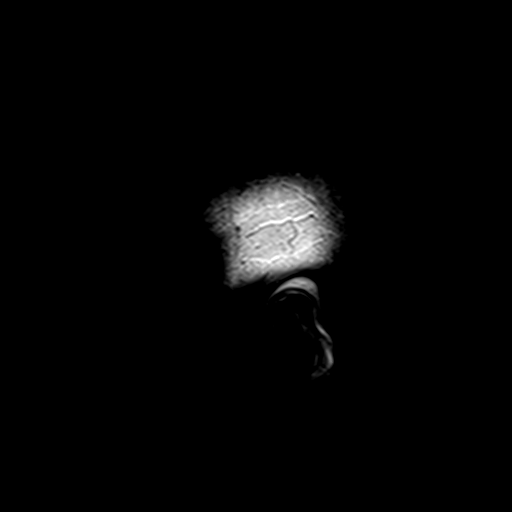
[im 40/40]
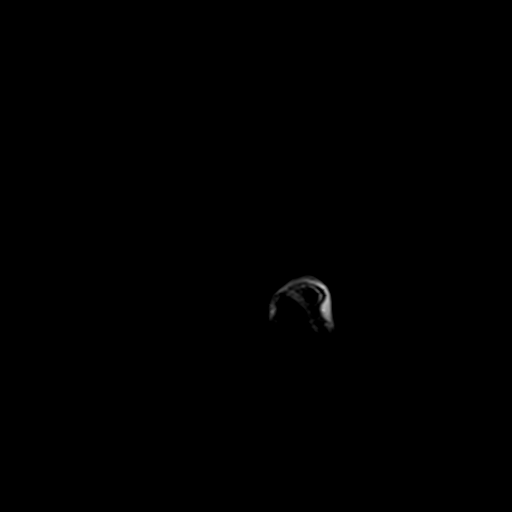

[Series 12: t1_mpr_tra · axial · 1.0mm · 0.72mm/px · z∈[-57,+99]mm · 9 of 160 slices shown]
[im 1/160]
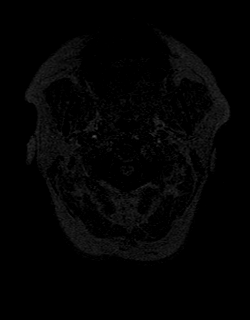
[im 20/160]
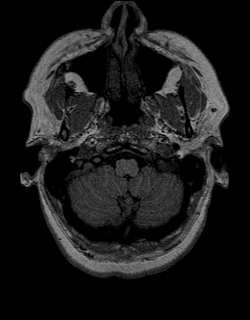
[im 40/160]
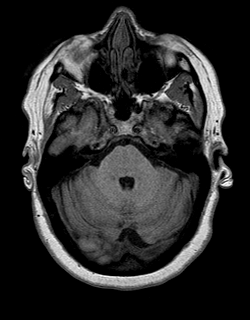
[im 60/160]
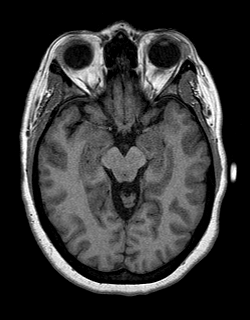
[im 80/160]
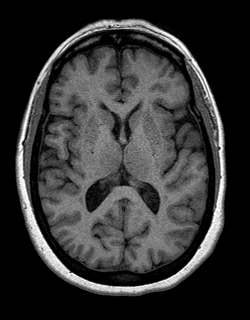
[im 100/160]
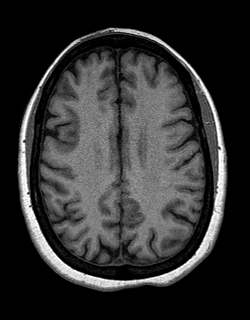
[im 120/160]
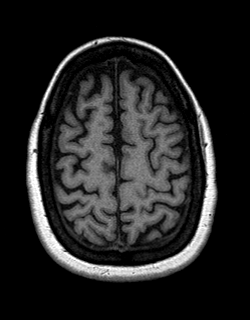
[im 140/160]
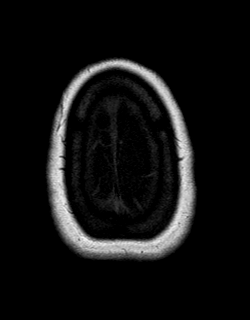
[im 160/160]
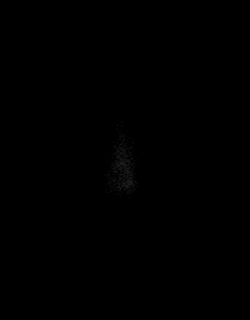

[Series 13: T2 · coronal · 5.0mm · 0.45mm/px · 2 of 31 slices shown (2 of 2)]
[im 1/31]
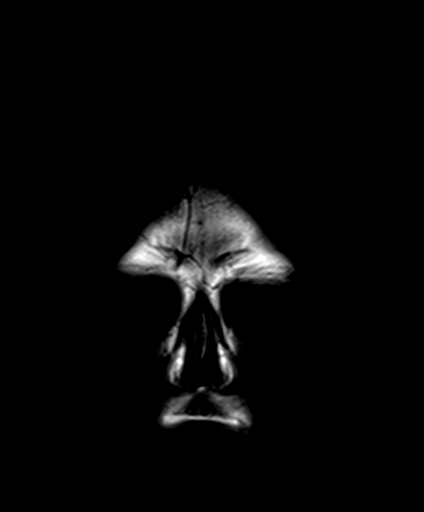
[im 31/31]
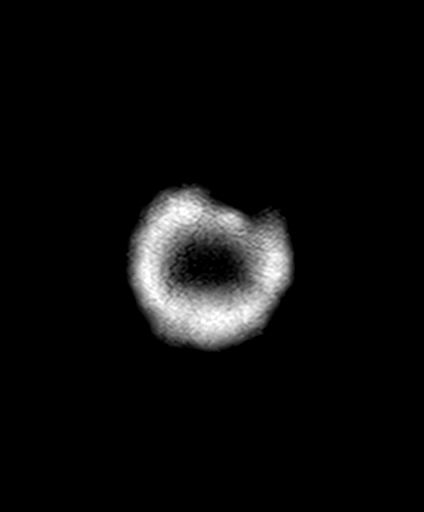

[Series 14: T1 post-contrast · coronal · 5.0mm · 0.72mm/px · 1 of 26 slices shown]
[im 1/26]
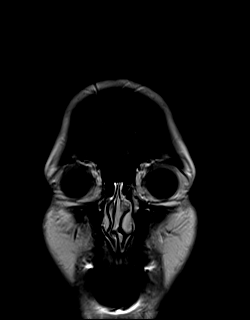

[Series 15: post t1_mpr_tra · axial · 1.0mm · 0.72mm/px · z∈[-57,+99]mm · 9 of 160 slices shown]
[im 1/160]
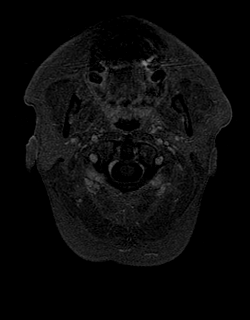
[im 20/160]
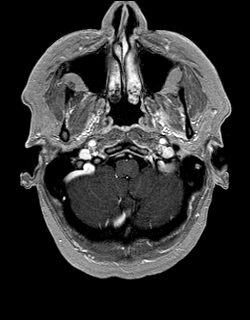
[im 40/160]
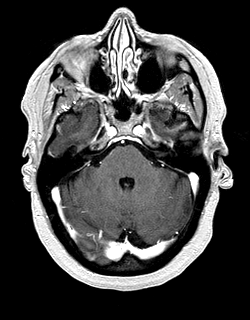
[im 60/160]
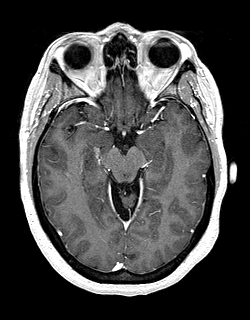
[im 80/160]
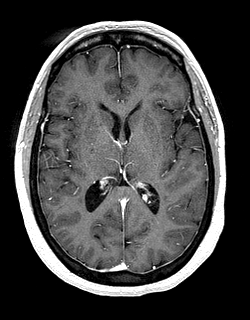
[im 100/160]
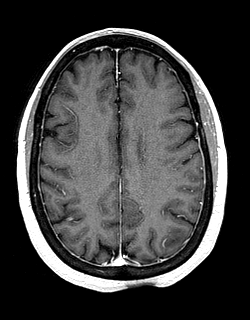
[im 120/160]
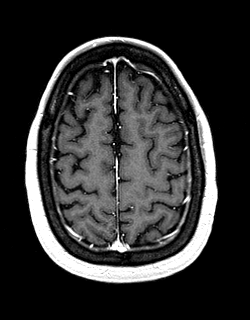
[im 140/160]
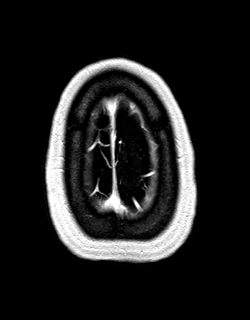
[im 160/160]
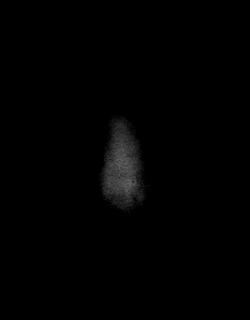

[48 of 48 positions shown; findings below may reference images not displayed]

FINDINGS: Brain:

Cerebral volume is normal for age.

Multifocal T2/FLAIR hyperintense signal abnormality within the
cerebral white matter, overall mild but advanced for age.

There is no acute infarct.

No evidence of an intracranial mass.

No chronic intracranial blood products.

No extra-axial fluid collection.

No midline shift.

No abnormal intracranial enhancement.

Vascular: Maintained flow voids within the proximal large arterial
vessels.

Skull and upper cervical spine: No focal marrow lesion.

Sinuses/Orbits: Visualized orbits show no acute finding. Trace
bilateral ethmoid sinus mucosal thickening.

Other: Trace fluid within the left mastoid air cells.
IMPRESSION: No evidence of acute intracranial abnormality.

Multifocal T2/FLAIR hyperintense signal abnormality within the
cerebral white matter, overall mild but advanced for age. These
signal changes are nonspecific and considerations include chronic
small vessel ischemic disease, sequela of a prior
infectious/inflammatory process, sequela of chronic migraine
headaches or sequela of a demyelinating process (such as multiple
sclerosis), among others.

Otherwise unremarkable MRI appearance of the brain.

Trace fluid within the left mastoid air cells.

## 2021-02-24 MED ORDER — GADOBENATE DIMEGLUMINE 529 MG/ML IV SOLN
20.0000 mL | Freq: Once | INTRAVENOUS | Status: AC | PRN
  Administered 2021-02-24: 20 mL via INTRAVENOUS

## 2021-02-28 ENCOUNTER — Encounter: Payer: Self-pay | Admitting: Neurology

## 2021-03-13 ENCOUNTER — Telehealth: Payer: Self-pay | Admitting: Neurology

## 2021-03-13 NOTE — Telephone Encounter (Signed)
error 

## 2021-04-14 ENCOUNTER — Other Ambulatory Visit: Payer: Self-pay | Admitting: Neurology

## 2021-04-14 ENCOUNTER — Other Ambulatory Visit (HOSPITAL_COMMUNITY): Payer: Self-pay | Admitting: Neurology

## 2021-04-14 DIAGNOSIS — G35 Multiple sclerosis: Secondary | ICD-10-CM

## 2021-05-16 ENCOUNTER — Ambulatory Visit (HOSPITAL_COMMUNITY)
Admission: RE | Admit: 2021-05-16 | Discharge: 2021-05-16 | Disposition: A | Discharge status: Home / Self Care | Payer: 59 | Source: Ambulatory Visit | Attending: Neurology | Admitting: Neurology

## 2021-05-16 DIAGNOSIS — G35 Multiple sclerosis: Secondary | ICD-10-CM | POA: Insufficient documentation

## 2021-05-16 IMAGING — MR MR CERVICAL SPINE W/O CM
4 of 6 series · 32 of 48 positions shown · non-contrast
Comparison: None.

CLINICAL DATA: History of MS, evaluation for possible lesion versus
degenerative changes.

EXAM:
MRI CERVICAL SPINE WITHOUT CONTRAST
TECHNIQUE: Multiplanar, multisequence MR imaging of the cervical spine was
performed. No intravenous contrast was administered.

[Series 5: T1 · sagittal · 3.0mm · 0.69mm/px · 5 of 15 slices shown (1 of 2)]
[im 1/15]
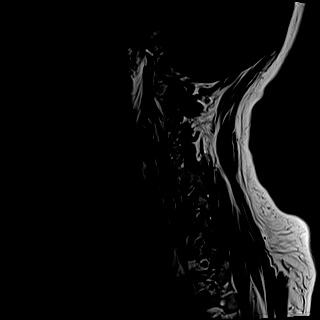
[im 4/15]
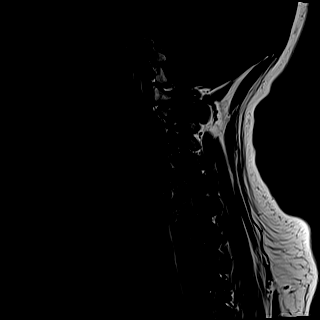
[im 8/15]
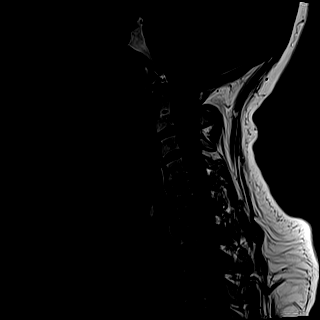
[im 11/15]
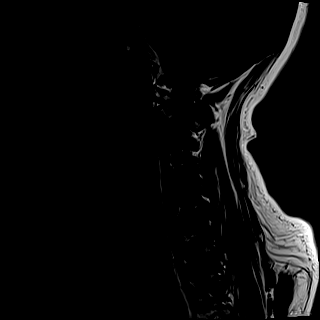
[im 15/15]
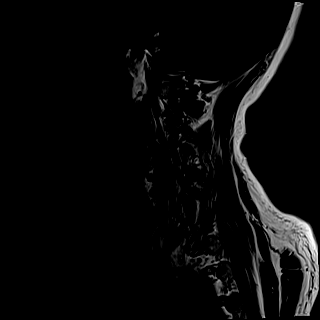

[Series 6: T2 · sagittal · 3.0mm · 0.69mm/px · 5 of 15 slices shown (1 of 2)]
[im 1/15]
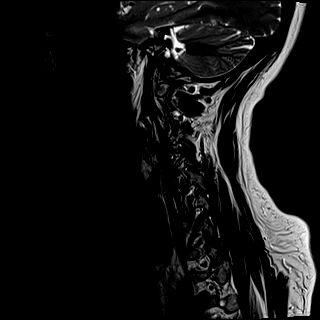
[im 4/15]
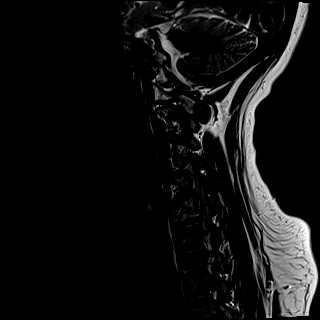
[im 8/15]
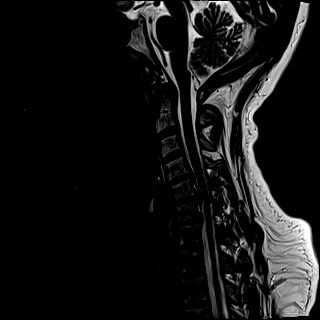
[im 11/15]
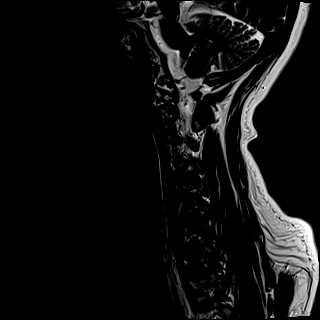
[im 15/15]
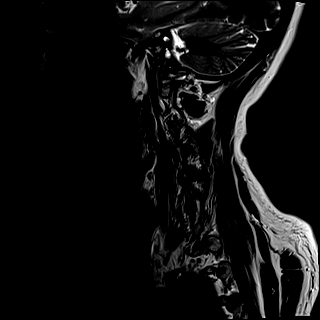

[Series 8: T2 · axial · 3.0mm · 0.70mm/px · z∈[-69,+28]mm · 11 of 30 slices shown (2 of 2)]
[im 1/30]
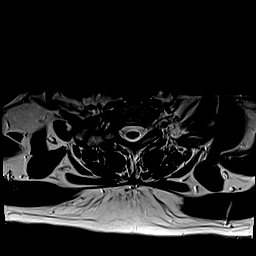
[im 3/30]
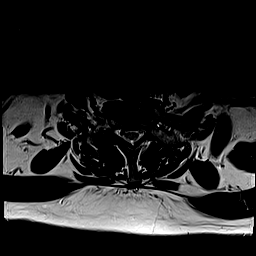
[im 6/30]
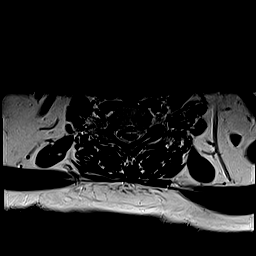
[im 9/30]
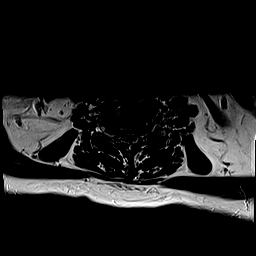
[im 12/30]
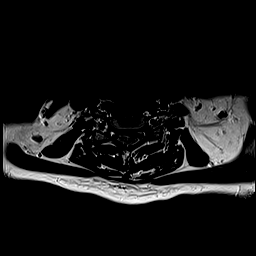
[im 15/30]
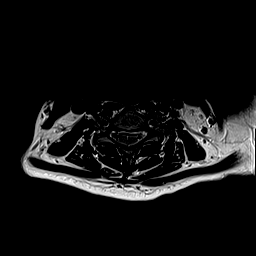
[im 18/30]
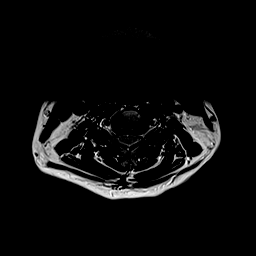
[im 21/30]
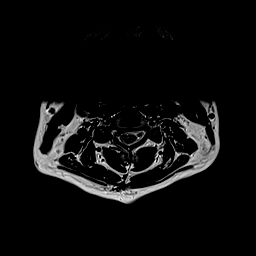
[im 24/30]
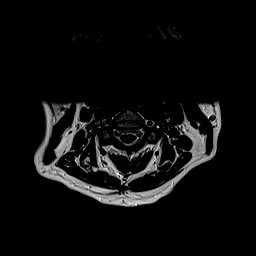
[im 27/30]
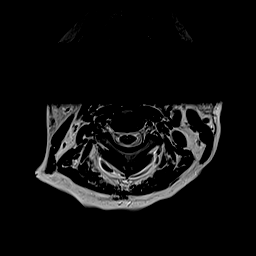
[im 30/30]
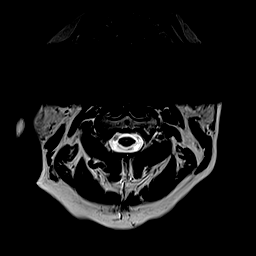

[Series 10: T1 · axial · 3.0mm · 0.35mm/px · z∈[-76,+21]mm · 11 of 30 slices shown (2 of 2)]
[im 1/30]
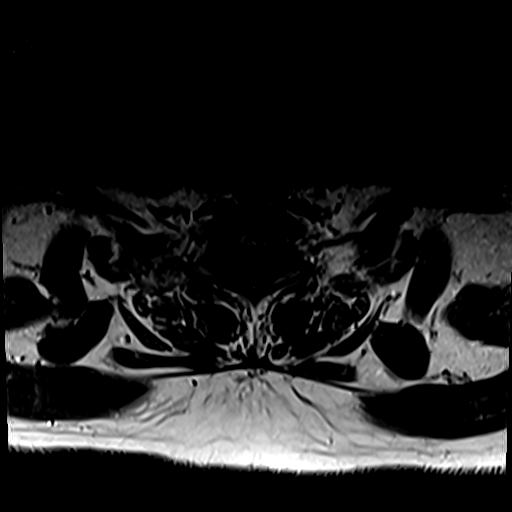
[im 3/30]
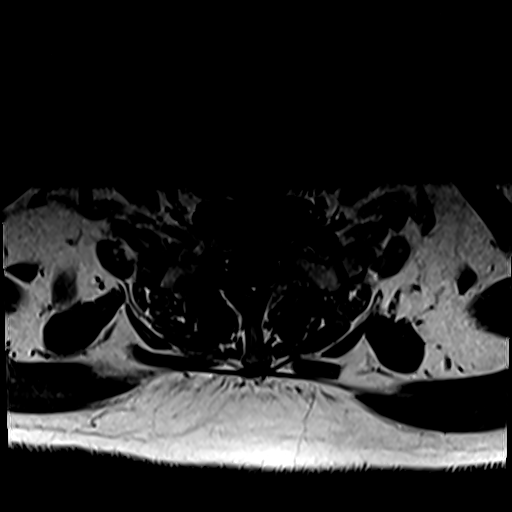
[im 6/30]
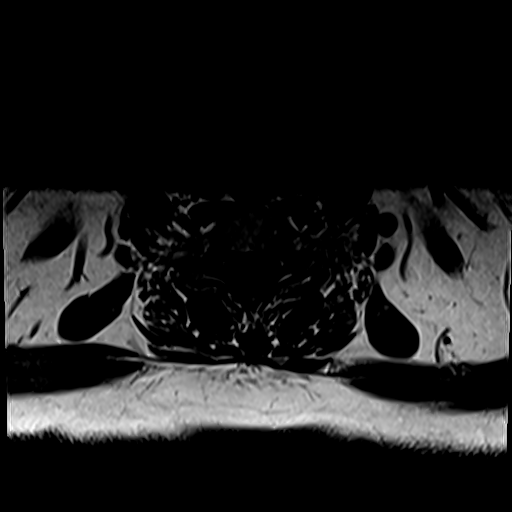
[im 9/30]
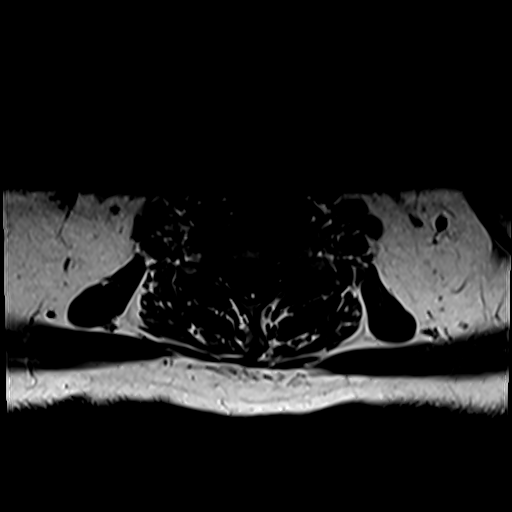
[im 12/30]
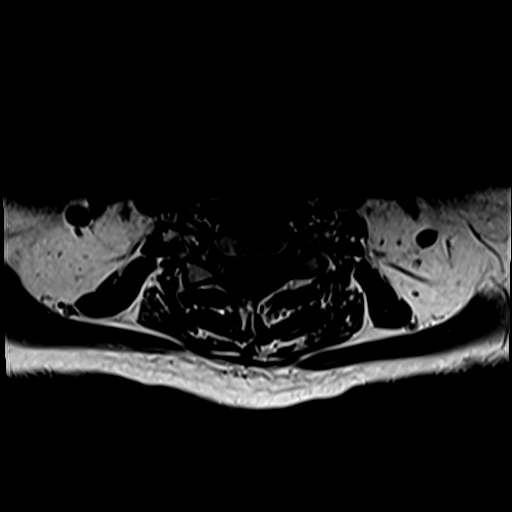
[im 15/30]
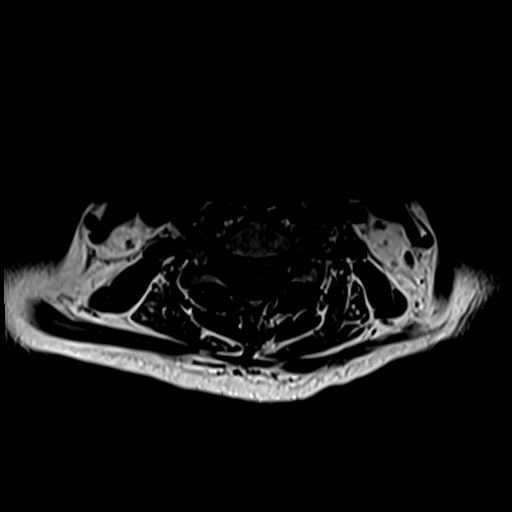
[im 18/30]
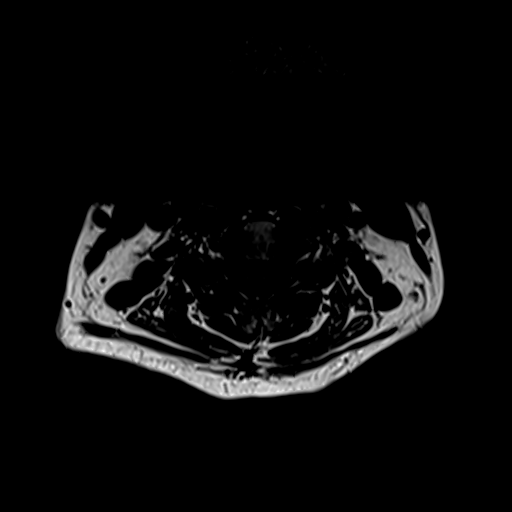
[im 21/30]
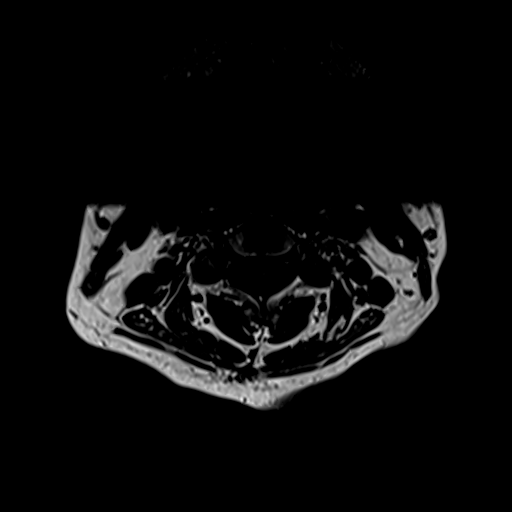
[im 24/30]
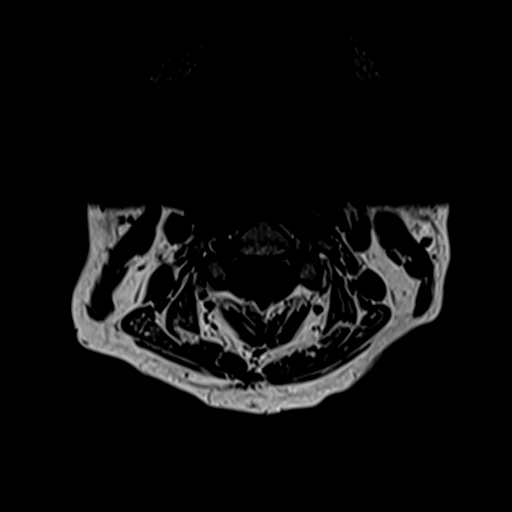
[im 27/30]
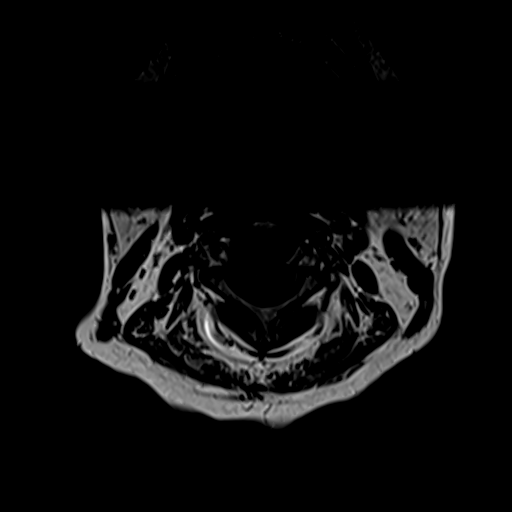
[im 30/30]
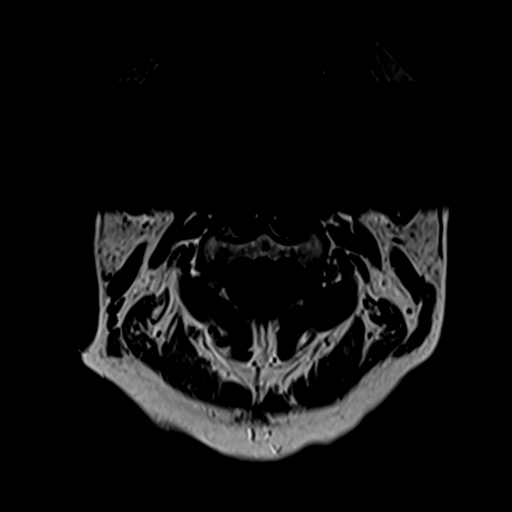

[32 of 48 positions shown; findings below may reference images not displayed]

FINDINGS: Alignment: Straightening and mild reversal of the normal cervical
lordosis. No significant listhesis.

Vertebrae: No acute fracture or suspicious osseous lesion.
Congenitally short pedicles, which narrow the AP diameter of the
spinal canal.

Cord: Normal signal. Mild cord flattening at C5-C6, secondary to
disc protrusion. Otherwise normal cord morphology.

Posterior Fossa, vertebral arteries, paraspinal tissues: Negative.

Disc levels:

C2-C3: No significant disc bulge. No spinal canal stenosis or
neuroforaminal narrowing.

C3-C4: No significant disc bulge. No spinal canal stenosis or
neuroforaminal narrowing.

C4-C5: Mild broad-based disc bulge. Mild spinal canal stenosis. Mild
left neural foraminal narrowing.

C5-C6: Disc height loss with disc bulge with superimposed left
paracentral and foraminal protrusion, which indents the ventral
cord, with annular fissure. Moderate spinal canal stenosis. Severe
left neural foraminal narrowing.

C6-C7: Disc height loss without significant disc bulge. No spinal
canal stenosis. Uncovertebral hypertrophy. Moderate to severe left
neural foraminal narrowing.

C7-T1: No significant disc bulge. Mild left uncovertebral
hypertrophy. Mild left neural foraminal narrowing. No spinal canal
stenosis.
IMPRESSION: 1. C5-C6 moderate spinal canal stenosis and severe left neural
foraminal narrowing, secondary to a left paracentral and foraminal
disc protrusion with annular fissure, which indents the ventral
cord.
2. C4-C5 mild spinal canal stenosis and mild left neural foraminal
narrowing.
3. C6-C7 moderate to severe left neural foraminal narrowing.
4. No abnormal cord signal to suggest demyelinating disease in the
cervical spinal cord.

## 2021-05-22 ENCOUNTER — Ambulatory Visit: Payer: 59 | Admitting: Neurology

## 2021-07-16 ENCOUNTER — Other Ambulatory Visit: Payer: Self-pay | Admitting: Obstetrics and Gynecology

## 2021-07-16 DIAGNOSIS — R928 Other abnormal and inconclusive findings on diagnostic imaging of breast: Secondary | ICD-10-CM

## 2021-08-15 ENCOUNTER — Other Ambulatory Visit: Payer: Self-pay

## 2021-08-22 DIAGNOSIS — L821 Other seborrheic keratosis: Secondary | ICD-10-CM | POA: Insufficient documentation

## 2021-10-24 DIAGNOSIS — R7303 Prediabetes: Secondary | ICD-10-CM | POA: Insufficient documentation

## 2021-10-24 DIAGNOSIS — I10 Essential (primary) hypertension: Secondary | ICD-10-CM | POA: Insufficient documentation

## 2021-10-24 DIAGNOSIS — F419 Anxiety disorder, unspecified: Secondary | ICD-10-CM | POA: Insufficient documentation

## 2021-10-24 DIAGNOSIS — E782 Mixed hyperlipidemia: Secondary | ICD-10-CM | POA: Insufficient documentation

## 2021-11-25 DIAGNOSIS — R9082 White matter disease, unspecified: Secondary | ICD-10-CM | POA: Insufficient documentation

## 2021-11-25 DIAGNOSIS — G43009 Migraine without aura, not intractable, without status migrainosus: Secondary | ICD-10-CM | POA: Insufficient documentation

## 2022-07-09 ENCOUNTER — Emergency Department (HOSPITAL_COMMUNITY): Payer: BLUE CROSS/BLUE SHIELD

## 2022-07-09 ENCOUNTER — Encounter (HOSPITAL_COMMUNITY): Payer: Self-pay

## 2022-07-09 ENCOUNTER — Emergency Department (HOSPITAL_COMMUNITY)
Admission: EM | Admit: 2022-07-09 | Discharge: 2022-07-09 | Discharge status: Against Medical Advice | Payer: BLUE CROSS/BLUE SHIELD | Attending: Emergency Medicine | Admitting: Emergency Medicine

## 2022-07-09 ENCOUNTER — Other Ambulatory Visit: Payer: Self-pay

## 2022-07-09 DIAGNOSIS — Z5321 Procedure and treatment not carried out due to patient leaving prior to being seen by health care provider: Secondary | ICD-10-CM | POA: Insufficient documentation

## 2022-07-09 DIAGNOSIS — W01198A Fall on same level from slipping, tripping and stumbling with subsequent striking against other object, initial encounter: Secondary | ICD-10-CM | POA: Diagnosis not present

## 2022-07-09 DIAGNOSIS — S0990XA Unspecified injury of head, initial encounter: Secondary | ICD-10-CM | POA: Insufficient documentation

## 2022-07-09 DIAGNOSIS — F10120 Alcohol abuse with intoxication, uncomplicated: Secondary | ICD-10-CM | POA: Diagnosis not present

## 2022-07-09 NOTE — ED Triage Notes (Signed)
EMS STATES THAT THE PATIENT WAS CLEANING HER LITTER BOX WHEN SHE STOOD UP AND LOST HER BALANCE. SHE FELL BACKWARDS AND HIT THE BACK OF HER HEAD, HAS A 1IN LACERATION. EMS ALSO STATES THAT SHE IS INTOXICATED AT THIS TIME

## 2022-07-09 NOTE — ED Provider Triage Note (Signed)
Emergency Medicine Provider Triage Evaluation Note  Linda Hodge , a 51 y.o. female  was evaluated in triage.  Pt complains of head injury.  Patient states that she was drinking some alcohol in excess this evening and cleaning out her Box when she missed the for step on her porch and fell backwards striking her head.  Patient does not recall losing consciousness but she stated that her son who called EMS stated that she did.  Currently, she feels normal.  Review of Systems  Positive: See above  Negative:   Physical Exam  BP (!) 147/87 (BP Location: Left Arm)   Pulse 88   Temp 98.6 F (37 C) (Oral)   Resp 18   Ht 5\' 2"  (1.575 m)   Wt 74.8 kg   SpO2 91%   BMI 30.18 kg/m  Gen:   Awake, no distress   Resp:  Normal effort  MSK:   Moves extremities without difficulty  Other:  1.5 cm occipital laceration.  Medical Decision Making  Medically screening exam initiated at 6:58 PM.  Appropriate orders placed.  Darnell Archbold was informed that the remainder of the evaluation will be completed by another provider, this initial triage assessment does not replace that evaluation, and the importance of remaining in the ED until their evaluation is complete.     Isaac Bliss [place], [place] 07/09/22 1859

## 2022-07-19 DIAGNOSIS — Z4802 Encounter for removal of sutures: Secondary | ICD-10-CM | POA: Diagnosis not present

## 2022-09-18 DIAGNOSIS — R9082 White matter disease, unspecified: Secondary | ICD-10-CM | POA: Diagnosis not present

## 2022-09-18 DIAGNOSIS — R1115 Cyclical vomiting syndrome unrelated to migraine: Secondary | ICD-10-CM | POA: Insufficient documentation

## 2022-09-18 DIAGNOSIS — G43009 Migraine without aura, not intractable, without status migrainosus: Secondary | ICD-10-CM | POA: Diagnosis not present

## 2022-10-09 DIAGNOSIS — R7303 Prediabetes: Secondary | ICD-10-CM | POA: Diagnosis not present

## 2022-11-20 DIAGNOSIS — Z5181 Encounter for therapeutic drug level monitoring: Secondary | ICD-10-CM | POA: Diagnosis not present

## 2022-11-20 DIAGNOSIS — L732 Hidradenitis suppurativa: Secondary | ICD-10-CM | POA: Diagnosis not present

## 2022-11-20 DIAGNOSIS — L409 Psoriasis, unspecified: Secondary | ICD-10-CM | POA: Diagnosis not present

## 2022-11-20 DIAGNOSIS — D1801 Hemangioma of skin and subcutaneous tissue: Secondary | ICD-10-CM | POA: Diagnosis not present

## 2022-11-20 DIAGNOSIS — L821 Other seborrheic keratosis: Secondary | ICD-10-CM | POA: Diagnosis not present

## 2022-11-20 DIAGNOSIS — L814 Other melanin hyperpigmentation: Secondary | ICD-10-CM | POA: Diagnosis not present

## 2022-12-17 DIAGNOSIS — G43009 Migraine without aura, not intractable, without status migrainosus: Secondary | ICD-10-CM | POA: Diagnosis not present

## 2022-12-17 DIAGNOSIS — R9082 White matter disease, unspecified: Secondary | ICD-10-CM | POA: Diagnosis not present

## 2022-12-17 DIAGNOSIS — R1115 Cyclical vomiting syndrome unrelated to migraine: Secondary | ICD-10-CM | POA: Diagnosis not present

## 2022-12-18 DIAGNOSIS — R112 Nausea with vomiting, unspecified: Secondary | ICD-10-CM | POA: Diagnosis not present

## 2022-12-18 DIAGNOSIS — K2270 Barrett's esophagus without dysplasia: Secondary | ICD-10-CM | POA: Diagnosis not present

## 2022-12-18 DIAGNOSIS — R9082 White matter disease, unspecified: Secondary | ICD-10-CM | POA: Diagnosis not present

## 2022-12-18 DIAGNOSIS — R634 Abnormal weight loss: Secondary | ICD-10-CM | POA: Diagnosis not present

## 2022-12-20 DIAGNOSIS — R112 Nausea with vomiting, unspecified: Secondary | ICD-10-CM | POA: Diagnosis not present

## 2022-12-20 DIAGNOSIS — G43909 Migraine, unspecified, not intractable, without status migrainosus: Secondary | ICD-10-CM | POA: Diagnosis not present

## 2022-12-20 DIAGNOSIS — Z823 Family history of stroke: Secondary | ICD-10-CM | POA: Diagnosis not present

## 2022-12-20 DIAGNOSIS — Z8249 Family history of ischemic heart disease and other diseases of the circulatory system: Secondary | ICD-10-CM | POA: Diagnosis not present

## 2022-12-20 DIAGNOSIS — I10 Essential (primary) hypertension: Secondary | ICD-10-CM | POA: Diagnosis not present

## 2022-12-20 DIAGNOSIS — K219 Gastro-esophageal reflux disease without esophagitis: Secondary | ICD-10-CM | POA: Diagnosis not present

## 2022-12-20 DIAGNOSIS — Z825 Family history of asthma and other chronic lower respiratory diseases: Secondary | ICD-10-CM | POA: Diagnosis not present

## 2022-12-20 DIAGNOSIS — E785 Hyperlipidemia, unspecified: Secondary | ICD-10-CM | POA: Diagnosis not present

## 2022-12-20 DIAGNOSIS — Z811 Family history of alcohol abuse and dependence: Secondary | ICD-10-CM | POA: Diagnosis not present

## 2022-12-20 DIAGNOSIS — M1990 Unspecified osteoarthritis, unspecified site: Secondary | ICD-10-CM | POA: Diagnosis not present

## 2022-12-20 DIAGNOSIS — Z720 Tobacco use: Secondary | ICD-10-CM | POA: Diagnosis not present

## 2022-12-20 DIAGNOSIS — G4700 Insomnia, unspecified: Secondary | ICD-10-CM | POA: Diagnosis not present

## 2023-01-01 DIAGNOSIS — R634 Abnormal weight loss: Secondary | ICD-10-CM | POA: Diagnosis not present

## 2023-01-01 DIAGNOSIS — R112 Nausea with vomiting, unspecified: Secondary | ICD-10-CM | POA: Diagnosis not present

## 2023-01-22 DIAGNOSIS — R112 Nausea with vomiting, unspecified: Secondary | ICD-10-CM | POA: Diagnosis not present

## 2023-01-22 DIAGNOSIS — R634 Abnormal weight loss: Secondary | ICD-10-CM | POA: Diagnosis not present

## 2023-02-05 DIAGNOSIS — K219 Gastro-esophageal reflux disease without esophagitis: Secondary | ICD-10-CM | POA: Diagnosis not present

## 2023-02-05 DIAGNOSIS — E782 Mixed hyperlipidemia: Secondary | ICD-10-CM | POA: Diagnosis not present

## 2023-02-05 DIAGNOSIS — R7303 Prediabetes: Secondary | ICD-10-CM | POA: Diagnosis not present

## 2023-02-05 DIAGNOSIS — Z79899 Other long term (current) drug therapy: Secondary | ICD-10-CM | POA: Diagnosis not present

## 2023-02-05 DIAGNOSIS — R1115 Cyclical vomiting syndrome unrelated to migraine: Secondary | ICD-10-CM | POA: Diagnosis not present

## 2023-02-05 DIAGNOSIS — I10 Essential (primary) hypertension: Secondary | ICD-10-CM | POA: Diagnosis not present

## 2023-02-05 DIAGNOSIS — F419 Anxiety disorder, unspecified: Secondary | ICD-10-CM | POA: Diagnosis not present

## 2023-06-15 ENCOUNTER — Encounter (HOSPITAL_COMMUNITY): Payer: Self-pay

## 2023-06-15 ENCOUNTER — Emergency Department (HOSPITAL_COMMUNITY)
Admission: EM | Admit: 2023-06-15 | Discharge: 2023-06-15 | Disposition: A | Discharge status: Home / Self Care | Payer: 59 | Attending: Emergency Medicine | Admitting: Emergency Medicine

## 2023-06-15 ENCOUNTER — Other Ambulatory Visit: Payer: Self-pay

## 2023-06-15 DIAGNOSIS — R1111 Vomiting without nausea: Secondary | ICD-10-CM | POA: Diagnosis not present

## 2023-06-15 DIAGNOSIS — R55 Syncope and collapse: Secondary | ICD-10-CM | POA: Diagnosis not present

## 2023-06-15 DIAGNOSIS — Z79899 Other long term (current) drug therapy: Secondary | ICD-10-CM | POA: Diagnosis not present

## 2023-06-15 DIAGNOSIS — R110 Nausea: Secondary | ICD-10-CM | POA: Diagnosis not present

## 2023-06-15 DIAGNOSIS — R1115 Cyclical vomiting syndrome unrelated to migraine: Secondary | ICD-10-CM | POA: Insufficient documentation

## 2023-06-15 DIAGNOSIS — R030 Elevated blood-pressure reading, without diagnosis of hypertension: Secondary | ICD-10-CM | POA: Diagnosis not present

## 2023-06-15 DIAGNOSIS — R112 Nausea with vomiting, unspecified: Secondary | ICD-10-CM | POA: Diagnosis present

## 2023-06-15 DIAGNOSIS — I10 Essential (primary) hypertension: Secondary | ICD-10-CM | POA: Diagnosis not present

## 2023-06-15 LAB — HCG, SERUM, QUALITATIVE: Preg, Serum: NEGATIVE

## 2023-06-15 LAB — CBC
HCT: 42.3 % (ref 36.0–46.0)
Hemoglobin: 14.3 g/dL (ref 12.0–15.0)
MCH: 32.1 pg (ref 26.0–34.0)
MCHC: 33.8 g/dL (ref 30.0–36.0)
MCV: 94.8 fL (ref 80.0–100.0)
Platelets: 142 10*3/uL — ABNORMAL LOW (ref 150–400)
RBC: 4.46 MIL/uL (ref 3.87–5.11)
RDW: 12.5 % (ref 11.5–15.5)
WBC: 7.2 10*3/uL (ref 4.0–10.5)
nRBC: 0 % (ref 0.0–0.2)

## 2023-06-15 LAB — BASIC METABOLIC PANEL
Anion gap: 10 (ref 5–15)
BUN: 10 mg/dL (ref 6–20)
CO2: 21 mmol/L — ABNORMAL LOW (ref 22–32)
Calcium: 9.2 mg/dL (ref 8.9–10.3)
Chloride: 103 mmol/L (ref 98–111)
Creatinine, Ser: 0.45 mg/dL (ref 0.44–1.00)
GFR, Estimated: >60 mL/min (ref >60)
Glucose, Bld: 121 mg/dL — ABNORMAL HIGH (ref 70–99)
Potassium: 3.7 mmol/L (ref 3.5–5.1)
Sodium: 134 mmol/L — ABNORMAL LOW (ref 135–145)

## 2023-06-15 LAB — URINALYSIS, ROUTINE W REFLEX MICROSCOPIC
Bacteria, UA: NONE SEEN
Bilirubin Urine: NEGATIVE
Glucose, UA: NEGATIVE mg/dL
Hgb urine dipstick: NEGATIVE
Ketones, ur: 5 mg/dL — AB
Leukocytes,Ua: NEGATIVE
Nitrite: NEGATIVE
Protein, ur: 30 mg/dL — AB
Specific Gravity, Urine: 1.021 (ref 1.005–1.030)
pH: 5 (ref 5.0–8.0)

## 2023-06-15 LAB — CBG MONITORING, ED: Glucose-Capillary: 133 mg/dL — ABNORMAL HIGH (ref 70–99)

## 2023-06-15 LAB — RAPID URINE DRUG SCREEN, HOSP PERFORMED
Amphetamines: NOT DETECTED
Barbiturates: NOT DETECTED
Benzodiazepines: POSITIVE — AB
Cocaine: NOT DETECTED
Opiates: NOT DETECTED
Tetrahydrocannabinol: POSITIVE — AB

## 2023-06-15 LAB — TROPONIN I (HIGH SENSITIVITY): Troponin I (High Sensitivity): 5 ng/L (ref <18)

## 2023-06-15 MED ORDER — DIAZEPAM 5 MG/ML IJ SOLN: 5.0000 mg | Freq: Once | INTRAMUSCULAR | Status: DC

## 2023-06-15 MED ORDER — CAPSAICIN 0.025 % EX CREA
TOPICAL_CREAM | Freq: Once | CUTANEOUS | Status: AC
  Filled 2023-06-15: qty 60

## 2023-06-15 MED ORDER — DIAZEPAM 5 MG/ML IJ SOLN
2.5000 mg | Freq: Once | INTRAMUSCULAR | Status: AC
  Administered 2023-06-15: 2.5 mg via INTRAVENOUS
  Filled 2023-06-15: qty 2

## 2023-06-15 MED ORDER — DROPERIDOL 2.5 MG/ML IJ SOLN
1.2500 mg | Freq: Once | INTRAMUSCULAR | Status: AC
  Administered 2023-06-15: 1.25 mg via INTRAVENOUS
  Filled 2023-06-15: qty 2

## 2023-06-15 NOTE — ED Triage Notes (Addendum)
BIBA from work for unwitnessed syncopal episode, nausea. Negative for orthostatic changes per EMS. Hx of cyclical vomiting, HTN. 192/104 BP 82 HR 99% room air 16 RR 164 cbg

## 2023-06-15 NOTE — Discharge Instructions (Addendum)
Get help right away for the following: You have any of these symptoms that may indicate trouble with your heart: Fast or irregular heartbeats (palpitations). Unusual pain in your chest, abdomen, or back. Shortness of breath. You have a seizure. You have a severe headache. You are confused. You have vision problems. You have severe weakness or trouble walking. You are bleeding from your mouth or rectum, or you have black or tarry stool.

## 2023-06-15 NOTE — ED Provider Notes (Signed)
[facility] Provider Note   CSN: [id number] Arrival date & time: 06/15/23  1217     History  Chief Complaint  Patient presents with   Loss of Consciousness    Linda Hodge is a 52 y.o. female who presents for nausea, vomiting and loss of consciousness.  She has a past medical history of cyclic nausea and vomiting syndrome.  She states the only thing that helps it are very hot baths.  She does admit to a longstanding history of marijuana abuse but states that she really has not smoked for the past years and only uses it occasionally.  She has been seen by multiple providers in the outpatient state setting and states that "nobody can really tell me what is wrong with me."  Generally when she gets very sick or feels an episode coming on she takes Phenergan, hydroxyzine, and Ubrelvy.  She took that this morning and took a very hot bath however by the time she got to work she was vomiting excessively.  She returned home and took another very hot bath.  She is able to return to work but then passed out at work.  She reports she has never lost consciousness before.  She states her last episode of vomiting was morning at 10 AM.  She takes Bernita Raisin because she was told these could be "abdominal migraines."  She denies fevers, diarrhea or chest pain.   Loss of Consciousness      Home Medications Prior to Admission medications   Medication Sig Start Date End Date Taking? Authorizing Provider  albuterol (PROVENTIL HFA;VENTOLIN HFA) 108 (90 BASE) MCG/ACT inhaler Inhale 2 puffs into the lungs every 4 (four) hours as needed for wheezing or shortness of breath (cough, shortness of breath or wheezing.). Patient not taking: Reported on 05/14/2016 07/25/14   Brewington, Tishira R, PA-C  ALPRAZolam Prudy Feeler) 0.5 MG tablet Take 0.5 mg by mouth at bedtime as needed for anxiety or sleep.     [provider]  chlorpheniramine-HYDROcodone (TUSSIONEX PENNKINETIC  ER) 10-8 MG/5ML LQCR Take 5 mLs by mouth every 12 (twelve) hours as needed for cough (cough). Patient not taking: Reported on 05/14/2016 07/25/14   Brewington, Tishira R, PA-C  citalopram (CELEXA) 20 MG tablet Take 20 mg by mouth every morning. 05/02/16   [provider]  ibuprofen (ADVIL,MOTRIN) 200 MG tablet Take 200-400 mg by mouth every 6 (six) hours as needed for headache.    [provider]  metoCLOPramide (REGLAN) 10 MG tablet Take 1 tablet (10 mg total) by mouth every 6 (six) hours. 05/14/16   Elson Areas, PA-C  Norethindrone Acetate-Ethinyl Estradiol (JUNEL,LOESTRIN,MICROGESTIN) 1.5-30 MG-MCG tablet Take 1 tablet by mouth daily. Patient not taking: Reported on 05/14/2016 06/04/15   Devoria Albe, MD  omeprazole (PRILOSEC) 40 MG capsule Take 40 mg by mouth 2 (two) times daily. 05/02/16   [provider]      Allergies    Penicillins, Tramadol, and Sulfa antibiotics    Review of Systems   Review of Systems  Cardiovascular:  Positive for syncope.    Physical Exam Updated Vital Signs BP (!) 175/81   Pulse 83   Temp 97.8 F (36.6 C) (Oral)   Resp 20   SpO2 97%  Physical Exam Vitals and nursing note reviewed.  Constitutional:      General: She is not in acute distress.    Appearance: She is well-developed. She is not diaphoretic.  HENT:  Head: Normocephalic and atraumatic.     Right Ear: External ear normal.     Left Ear: External ear normal.     Nose: Nose normal.     Mouth/Throat:     Mouth: Mucous membranes are moist.  Eyes:     General: No scleral icterus.    Conjunctiva/sclera: Conjunctivae normal.  Cardiovascular:     Rate and Rhythm: Normal rate and regular rhythm.     Heart sounds: Normal heart sounds. No murmur heard.    No friction rub. No gallop.  Pulmonary:     Effort: Pulmonary effort is normal. No respiratory distress.     Breath sounds: Normal breath sounds.  Abdominal:     General: Bowel sounds are normal. There is no  distension.     Palpations: Abdomen is soft. There is no mass.     Tenderness: There is no abdominal tenderness. There is no guarding.  Musculoskeletal:     Cervical back: Normal range of motion.  Skin:    General: Skin is warm and dry.  Neurological:     Mental Status: She is alert and oriented to person, place, and time.  Psychiatric:        Behavior: Behavior normal.     ED Results / Procedures / Treatments   Labs (all labs ordered are listed, but only abnormal results are displayed) Labs Reviewed  CBC - Abnormal; Notable for the following components:      Result Value   Platelets 142 (*)    All other components within normal limits  CBG MONITORING, ED - Abnormal; Notable for the following components:   Glucose-Capillary 133 (*)    All other components within normal limits  BASIC METABOLIC PANEL  URINALYSIS, ROUTINE W REFLEX MICROSCOPIC  HCG, SERUM, QUALITATIVE    EKG None  Radiology No results found.  Procedures Procedures    Medications Ordered in ED Medications - No data to display  ED Course/ Medical Decision Making/ A&P Clinical Course as of 06/15/23 1514  Tue Jun 15, 2023  1450 Tetrahydrocannabinol(!): POSITIVE [AH]  1450 EKG shows sinus rhythm at a rate of 86 with early repole [AH]  1514 Glucose-Capillary(!): 133 [AH]  1514 CBC(!) [AH]  1514 hCG, serum, qualitative [AH]  1514 Basic metabolic panel(!) [AH]  1514 Troponin I (High Sensitivity): 5 [AH]    Clinical Course User Index [AH] Arthor Captain, PA-C                                 Medical Decision Making This patient presents to the ED for concern of N/V and syncope, this involves an extensive number of treatment options, and is a complaint that carries with it a high risk of complications and morbidity.  The emergent differential diagnosis for vomiting includes, but is not limited to ACS/MI, DKA, Ischemic bowel, Meningitis, Sepsis, Acute gastric dilation, Adrenal insufficiency, Appendicitis,   Bowel obstruction/ileus, Carbon monoxide poisoning, Cholecystitis, Electrolyte abnormalities, Elevated ICP, Gastric outlet obstruction, Pancreatitis, Ruptured viscus, Biliary colic, Cannabinoid hyperemesis syndrome, Gastritis, Gastroenteritis, Gastroparesis,  Narcotic withdrawal, Peptic ulcer disease, and UTI The differential for syncope is extensive and includes, but is not limited to: arrythmia (Vtach, SVT, SSS, sinus arrest, AV block, bradycardia) aortic stenosis, AMI, HOCM, PE, atrial myxoma, pulmonary hypertension, orthostatic hypotension, (hypovolemia, drug effect, GB syndrome, micturition, cough, swall) carotid sinus sensitivity, Seizure, TIA/CVA, hypoglycemia,  Vertigo.   Co morbidities:       has a  past medical history of Anxiety, Depression, and Psoriasis.   Social Determinants of Health:       SDOH Screenings Tobacco Use: Medium Risk (06/15/2023) Cannabis use  Additional history:  {Additional history obtained from EMR   Lab Tests:  I Ordered, and personally interpreted labs.  The pertinent results include:   As per ED course     Cardiac Monitoring/ECG:       The patient was maintained on a cardiac monitor.  I personally viewed and interpreted the cardiac monitored which showed an underlying rhythm of: NSR  Medicines ordered and prescription drug management:  I ordered medication including Medications droperidol (INAPSINE) 2.5 MG/ML injection 1.25 mg (1.25 mg Intravenous Given 06/15/23 1409) capsaicin (ZOSTRIX) 0.025 % cream ( Topical Given 06/15/23 1413) diazepam (VALIUM) injection 2.5 mg (2.5 mg Intravenous Given 06/15/23 1410) for n/v Reevaluation of the patient after these medicines showed that the patient improved I have reviewed the patients home medicines and have made adjustments as needed  Test Considered:         Critical Interventions:         Consultations Obtained:   Problem List / ED Course:       (R11.15) Cyclic vomiting syndrome   (primary encounter diagnosis)  (R55) Syncope, unspecified syncope type   MDM: patient here for vomiting. Suspect Cannabanoid Hyperemesis. On exam, patient is afebrile, hemodynamically stable with no focal neuro deficits and benign cardiopulmonary exam. EKG benign.No personal or family history of sudden, cardiac death.  No hx of CHF.  No complaints of chest pain or shortness of breath. Low risk per [facility] Syncope Rule.  Will d/c home to follow up with pcp.  Dispostion:  After consideration of the diagnostic results and the patients response to treatment, I feel that the patent would benefit from discharge.    Amount and/or Complexity of Data Reviewed Labs: ordered. Decision-making details documented in ED Course.  Risk OTC drugs. Prescription drug management.           Final Clinical Impression(s) / ED Diagnoses Final diagnoses:  Cyclic vomiting syndrome  Syncope, unspecified syncope type  Elevated blood pressure reading    Rx / DC Orders ED Discharge Orders     None         Arthor Captain, PA-C 06/15/23 1521    Linwood Dibbles, MD 06/16/23 1101

## 2023-07-02 DIAGNOSIS — Z0000 Encounter for general adult medical examination without abnormal findings: Secondary | ICD-10-CM | POA: Diagnosis not present

## 2023-07-02 DIAGNOSIS — I10 Essential (primary) hypertension: Secondary | ICD-10-CM | POA: Diagnosis not present

## 2023-07-02 DIAGNOSIS — R7303 Prediabetes: Secondary | ICD-10-CM | POA: Diagnosis not present

## 2023-07-02 DIAGNOSIS — E782 Mixed hyperlipidemia: Secondary | ICD-10-CM | POA: Diagnosis not present

## 2023-07-02 DIAGNOSIS — Z1231 Encounter for screening mammogram for malignant neoplasm of breast: Secondary | ICD-10-CM | POA: Diagnosis not present

## 2023-07-02 DIAGNOSIS — F419 Anxiety disorder, unspecified: Secondary | ICD-10-CM | POA: Diagnosis not present

## 2023-07-02 DIAGNOSIS — Z23 Encounter for immunization: Secondary | ICD-10-CM | POA: Diagnosis not present
